# Patient Record
Sex: Male | Born: 2000 | Race: White | Hispanic: No | Marital: Single | Smoking: Current every day smoker
Health system: Southern US, Community
[De-identification: ages and names within clinical notes are randomized; demographics above are authoritative.]

## PROBLEM LIST (undated history)

## (undated) DIAGNOSIS — Z9622 Myringotomy tube(s) status: Secondary | ICD-10-CM

## (undated) DIAGNOSIS — F419 Anxiety disorder, unspecified: Secondary | ICD-10-CM

## (undated) DIAGNOSIS — H9190 Unspecified hearing loss, unspecified ear: Secondary | ICD-10-CM

## (undated) DIAGNOSIS — R569 Unspecified convulsions: Secondary | ICD-10-CM

## (undated) DIAGNOSIS — F445 Conversion disorder with seizures or convulsions: Secondary | ICD-10-CM

## (undated) DIAGNOSIS — H918X9 Other specified hearing loss, unspecified ear: Secondary | ICD-10-CM

## (undated) HISTORY — DX: Unspecified hearing loss, unspecified ear: H91.90

## (undated) HISTORY — DX: Other specified hearing loss, unspecified ear: H91.8X9

## (undated) HISTORY — DX: Anxiety disorder, unspecified: F41.9

## (undated) HISTORY — DX: Myringotomy tube(s) status: Z96.22

---
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
## 2019-05-03 DIAGNOSIS — Z711 Person with feared health complaint in whom no diagnosis is made: Secondary | ICD-10-CM | POA: Diagnosis not present

## 2019-06-15 DIAGNOSIS — K920 Hematemesis: Secondary | ICD-10-CM | POA: Diagnosis not present

## 2019-06-15 DIAGNOSIS — R634 Abnormal weight loss: Secondary | ICD-10-CM | POA: Diagnosis not present

## 2019-06-17 DIAGNOSIS — K219 Gastro-esophageal reflux disease without esophagitis: Secondary | ICD-10-CM | POA: Diagnosis not present

## 2019-06-17 DIAGNOSIS — R112 Nausea with vomiting, unspecified: Secondary | ICD-10-CM | POA: Diagnosis not present

## 2019-06-24 ENCOUNTER — Other Ambulatory Visit: Payer: Self-pay | Admitting: Gastroenterology

## 2019-06-24 DIAGNOSIS — R112 Nausea with vomiting, unspecified: Secondary | ICD-10-CM

## 2019-07-02 ENCOUNTER — Ambulatory Visit
Admission: RE | Admit: 2019-07-02 | Discharge: 2019-07-02 | Disposition: A | Discharge status: Home / Self Care | Payer: Federal, State, Local not specified - PPO | Source: Ambulatory Visit | Attending: Gastroenterology | Admitting: Gastroenterology

## 2019-07-02 ENCOUNTER — Other Ambulatory Visit: Payer: Self-pay | Admitting: Gastroenterology

## 2019-07-02 DIAGNOSIS — R112 Nausea with vomiting, unspecified: Secondary | ICD-10-CM

## 2019-07-06 ENCOUNTER — Ambulatory Visit
Admission: RE | Admit: 2019-07-06 | Discharge: 2019-07-06 | Disposition: A | Discharge status: Home / Self Care | Payer: Federal, State, Local not specified - PPO | Source: Ambulatory Visit | Attending: Gastroenterology | Admitting: Gastroenterology

## 2019-07-06 DIAGNOSIS — K219 Gastro-esophageal reflux disease without esophagitis: Secondary | ICD-10-CM | POA: Diagnosis not present

## 2019-07-06 DIAGNOSIS — R112 Nausea with vomiting, unspecified: Secondary | ICD-10-CM

## 2019-08-30 ENCOUNTER — Emergency Department (HOSPITAL_COMMUNITY): Payer: Federal, State, Local not specified - PPO

## 2019-08-30 ENCOUNTER — Encounter (HOSPITAL_COMMUNITY): Payer: Self-pay | Admitting: Emergency Medicine

## 2019-08-30 ENCOUNTER — Emergency Department (HOSPITAL_COMMUNITY)
Admission: EM | Admit: 2019-08-30 | Discharge: 2019-08-30 | Disposition: A | Discharge status: Home / Self Care | Payer: Federal, State, Local not specified - PPO | Attending: Emergency Medicine | Admitting: Emergency Medicine

## 2019-08-30 ENCOUNTER — Other Ambulatory Visit: Payer: Self-pay

## 2019-08-30 DIAGNOSIS — R110 Nausea: Secondary | ICD-10-CM | POA: Diagnosis not present

## 2019-08-30 DIAGNOSIS — R509 Fever, unspecified: Secondary | ICD-10-CM | POA: Diagnosis not present

## 2019-08-30 DIAGNOSIS — K920 Hematemesis: Secondary | ICD-10-CM | POA: Diagnosis not present

## 2019-08-30 LAB — URINALYSIS, ROUTINE W REFLEX MICROSCOPIC
Bilirubin Urine: NEGATIVE
Glucose, UA: NEGATIVE mg/dL
Hgb urine dipstick: NEGATIVE
Ketones, ur: NEGATIVE mg/dL
Leukocytes,Ua: NEGATIVE
Nitrite: NEGATIVE
Protein, ur: NEGATIVE mg/dL
Specific Gravity, Urine: 1.004 — ABNORMAL LOW (ref 1.005–1.030)
pH: 6 (ref 5.0–8.0)

## 2019-08-30 LAB — CBC
HCT: 49.3 % (ref 39.0–52.0)
Hemoglobin: 16.7 g/dL (ref 13.0–17.0)
MCH: 29.5 pg (ref 26.0–34.0)
MCHC: 33.9 g/dL (ref 30.0–36.0)
MCV: 87.1 fL (ref 80.0–100.0)
Platelets: 287 10*3/uL (ref 150–400)
RBC: 5.66 MIL/uL (ref 4.22–5.81)
RDW: 12.2 % (ref 11.5–15.5)
WBC: 4.9 10*3/uL (ref 4.0–10.5)
nRBC: 0 % (ref 0.0–0.2)

## 2019-08-30 LAB — COMPREHENSIVE METABOLIC PANEL
ALT: 19 U/L (ref 0–44)
AST: 20 U/L (ref 15–41)
Albumin: 5.2 g/dL — ABNORMAL HIGH (ref 3.5–5.0)
Alkaline Phosphatase: 80 U/L (ref 38–126)
Anion gap: 11 (ref 5–15)
BUN: 7 mg/dL (ref 6–20)
CO2: 27 mmol/L (ref 22–32)
Calcium: 10 mg/dL (ref 8.9–10.3)
Chloride: 103 mmol/L (ref 98–111)
Creatinine, Ser: 1.01 mg/dL (ref 0.61–1.24)
GFR calc Af Amer: >60 mL/min (ref >60)
GFR calc non Af Amer: >60 mL/min (ref >60)
Glucose, Bld: 105 mg/dL — ABNORMAL HIGH (ref 70–99)
Potassium: 3.5 mmol/L (ref 3.5–5.1)
Sodium: 141 mmol/L (ref 135–145)
Total Bilirubin: 1.9 mg/dL — ABNORMAL HIGH (ref 0.3–1.2)
Total Protein: 8.2 g/dL — ABNORMAL HIGH (ref 6.5–8.1)

## 2019-08-30 LAB — LIPASE, BLOOD: Lipase: 23 U/L (ref 11–51)

## 2019-08-30 MED ORDER — SODIUM CHLORIDE 0.9% FLUSH: 3.0000 mL | Freq: Once | INTRAVENOUS | Status: DC

## 2019-08-30 MED ORDER — ONDANSETRON 4 MG PO TBDP: 4.0000 mg | ORAL_TABLET | Freq: Three times a day (TID) | ORAL | 0 refills | Status: DC | PRN

## 2019-08-30 NOTE — ED Triage Notes (Signed)
Pt states he has been coughing up blood with clots also with abdominal pain x2 days. He is being sent by his Pediatrician office and is seeing [facility]. No Fevers.

## 2019-08-30 NOTE — ED Provider Notes (Signed)
[facility] Provider Note   CSN: [id number] Arrival date & time: 08/30/19  1403     History   Chief Complaint Chief Complaint  Patient presents with  . Hematemesis  . Abdominal Pain    HPI Stephen Underwood C Quincy is a 18 y.o. male presents to the ER for evaluation of blood in his vomit.  Reports bright red blood streaks in his emesis that began yesterday.  This has occurred a total of 3 times.  He vomited twice yesterday and once this morning and saw bright red streaks of blood mixed in with the emesis.  Bright red blood mixed with emesis but also when he cleared his throat after vomiting. No coffee-ground emesis.  Last time he threw up was early this morning and he has been drinking ginger ale without any vomiting since.  Admits that this happened once several months ago but he did not pay attention to it.  Today he called his primary care doctor Dr. Excell Seltzerooper who [facility].  [facility] tried [facility] but states "they are disregarding my phone calls", states that he has not been able to pay his bill so he thinks they are not going to see him in the office anymore.  Reports long history of nausea, vomiting for the last 1 year.  States he wakes up every single morning and throws up at least once.  Occasionally he will also throw up during the day.  [facility] and they have done testing and told him that he has acid reflux and "poor mobility" in his esophagus.  He is taking 40 mg of Protonix every morning on empty stomach.  This only helped for 1-1/2 weeks but then the vomiting returned.  Reports 50 pound weight loss in the last 6 months.  States sometimes just thinking about food makes him lose his appetite and he will go a whole day without eating.  States during the day he usually drinks either ginger ale or [employer].  He denies any chest pain, shortness of breath, abdominal or back pain,  lightheadedness, coffee-ground emesis, changes in his bowel movements, blood in his stool, melena.  No anticoagulants.  States he tried alcohol once but does not use this frequently.  He smoked marijuana 1 time a week ago but does not use it regularly.  He gets occasional headaches and takes ibuprofen but this is very rare.    HPI  Past Medical History:  Diagnosis Date  . History of tympanostomy tube placement 2003  . Mild to moderate hearing loss     There are no active problems to display for this patient.   History reviewed. No pertinent surgical history.      Home Medications    Prior to Admission medications   Medication Sig Start Date End Date Taking? Authorizing Provider  omeprazole (PRILOSEC) 40 MG capsule Take 40 mg by mouth daily before breakfast.  08/14/19  Yes [provider]  ondansetron (ZOFRAN ODT) 4 MG disintegrating tablet Take 1 tablet (4 mg total) by mouth every 8 (eight) hours as needed for nausea or vomiting. 08/30/19   Liberty HandyGibbons, Claudia J, PA-C    Family History Family History  Problem Relation Age of Onset  . Anemia Mother   . Depression Mother   . Heart murmur Father   . ADD / ADHD Father   . Epilepsy Father   . Peptic Ulcer Father   .  Bipolar disorder Father   . Ovarian cancer Maternal Grandmother   . Hypertension Paternal Grandfather   . Liver disease Paternal Grandfather   . Heart disease Paternal Grandfather     Social History Social History   Tobacco Use  . Smoking status: Never Smoker  . Smokeless tobacco: Never Used  Substance Use Topics  . Alcohol use: Not Currently  . Drug use: Yes    Types: Marijuana     Allergies   Patient has no known allergies.   Review of Systems Review of Systems  Gastrointestinal: Positive for nausea and vomiting.       Hematemesis   All other systems reviewed and are negative.    Physical Exam Updated Vital Signs BP (!) 132/95 (BP Location: Left Arm)   Pulse 63   Temp 98 F (36.7  C) (Oral)   Resp 11   SpO2 97%   Physical Exam Vitals signs and nursing note reviewed.  Constitutional:      Appearance: He is well-developed.     Comments: Non toxic.  HENT:     Head: Normocephalic and atraumatic.     Nose: Nose normal.  Eyes:     Conjunctiva/sclera: Conjunctivae normal.  Neck:     Musculoskeletal: Normal range of motion.     Comments: No tracheal or anterior/lateral neck tenderness, crepitus with mucous membranes.  Oropharynx and tonsils easily visualized.  Uvula is midline.  Normal oropharyngeal/throat exam. Cardiovascular:     Rate and Rhythm: Normal rate and regular rhythm.     Heart sounds: Normal heart sounds.  Pulmonary:     Effort: Pulmonary effort is normal.     Breath sounds: Normal breath sounds.  Abdominal:     General: Bowel sounds are normal.     Palpations: Abdomen is soft.     Tenderness: There is no abdominal tenderness.     Comments: No G/R/R. No suprapubic or CVA tenderness. Negative Murphy's and McBurney's  Musculoskeletal: Normal range of motion.  Skin:    General: Skin is warm and dry.     Capillary Refill: Capillary refill takes less than 2 seconds.  Neurological:     Mental Status: He is alert.  Psychiatric:        Behavior: Behavior normal.      ED Treatments / Results  Labs (all labs ordered are listed, but only abnormal results are displayed) Labs Reviewed  COMPREHENSIVE METABOLIC PANEL - Abnormal; Notable for the following components:      Result Value   Glucose, Bld 105 (*)    Total Protein 8.2 (*)    Albumin 5.2 (*)    Total Bilirubin 1.9 (*)    All other components within normal limits  URINALYSIS, ROUTINE W REFLEX MICROSCOPIC - Abnormal; Notable for the following components:   Color, Urine STRAW (*)    Specific Gravity, Urine 1.004 (*)    All other components within normal limits  LIPASE, BLOOD  CBC    EKG None  Radiology Dg Chest Portable 1 View  Result Date: 08/30/2019 CLINICAL DATA:  18 year old  male with hemoptysis. Abdominal pain 2 days. Denies fever. EXAM: PORTABLE CHEST 1 VIEW COMPARISON:  None. FINDINGS: Portable AP semi upright view at 2157 hours. Lung volumes compatible with good inspiratory effort. Normal cardiac size and mediastinal contours. Visualized tracheal air column is within normal limits. Allowing for portable technique the lungs are clear. No pneumothorax or pneumoperitoneum. Negative visible bowel gas pattern. No osseous abnormality identified. IMPRESSION: Negative portable chest. Electronically Signed  By: Odessa Fleming M.D.   On: 08/30/2019 22:15    Procedures Procedures (including critical care time)  Medications Ordered in ED Medications  sodium chloride flush (NS) 0.9 % injection 3 mL (has no administration in time range)     Initial Impression / Assessment and Plan / ED Course  I have reviewed the triage vital signs and the nursing notes.  Pertinent labs & imaging results that were available during my care of the patient were reviewed by me and considered in my medical decision making (see chart for details).  Given history, benign exam and normal ER work-up suspect Mallory-Weiss tear.  He is well-appearing.  No chest pain, shortness of breath, abdominal pain.  Hemodynamically stable.  ER work-up reviewed by me.  Hemoglobin is normal.  He is not on any blood thinners.  No known liver disease or esophageal varices.  I considered full thickness perforation of the esophagus highly unlikely.  He has no cough, chest pain or shortness of breath to suggest pulmonary/vascular pathology such as PE.  No infectious symptoms.  He reports longstanding history of nausea, vomiting for the last year but does not necessarily report any significant changes to his chronic symptoms.  Somehow patient has never been prescribed an antiemetic despite his cyclical nausea and vomiting at home daily.  He is compliant with Protonix.  No significant use of NSAIDs, alcohol use, marijuana,  anticoagulants.  Discussed with patient etiology likely a superficial esophageal tear.  Recommended soft diet, avoiding irritating foods and drinks, continuing Protonix.  Encouraged adding famotidine 10 mg twice daily and ODT Zofran to control nausea and vomiting.  Return precautions discussed.  Patient is comfortable with this.  He is having issues following up with his gastroenterology at [facility], I gave him 2 other GI offices to contact.  Final Clinical Impressions(s) / ED Diagnoses   Final diagnoses:  Hematemesis with nausea    ED Discharge Orders         Ordered    ondansetron (ZOFRAN ODT) 4 MG disintegrating tablet  Every 8 hours PRN     08/30/19 2256           Liberty Handy, PA-C 08/30/19 2354    Raeford Razor, MD 09/04/19 1104

## 2019-08-30 NOTE — Discharge Instructions (Addendum)
You were seen in the ER for bright red blood in your vomit  Labs are normal  I suspect you may have a superficial tear in your esophagus from forceful repetitive vomiting  Usually this heals with time, anti acid medicines.  It is important to avoid further vomiting.  Avoid irritating drinks and foods that will cause more damage to esophagus and stomach.  Soft gentle diet for the next 48-72 hours until symptoms improve.  Avoid spicy, greasy, acidic, hard foods. Small bites, chew well.   Avoid alcohol and marijuana, both can irritate stomach and digestion  Avoid ibuprofen, aspirin containing over the counter medicines because these can cause worsening stomach and esophagus irritation.  Take acetaminophen (tylenol) instead for pain as needed.   Take ondansetron every 8 hours under your tongue for the next 3 days to control and prevent vomiting  Stay hydrated  Call gastroenterology (see GI offices below) to make an appointment for re-evaluation   Return to ER for continued heavy bleeding in vomiting, black vomit, blood in stool, black stools, worsening abdominal pain, chest pain, shortness of breath, light headedness of passing out

## 2019-09-20 ENCOUNTER — Observation Stay (HOSPITAL_COMMUNITY): Payer: Federal, State, Local not specified - PPO

## 2019-09-20 ENCOUNTER — Other Ambulatory Visit: Payer: Self-pay

## 2019-09-20 ENCOUNTER — Observation Stay (HOSPITAL_COMMUNITY)
Admission: AD | Admit: 2019-09-20 | Discharge: 2019-09-21 | Disposition: A | Discharge status: Home / Self Care | Payer: Federal, State, Local not specified - PPO | Source: Ambulatory Visit | Attending: Pediatrics | Admitting: Pediatrics

## 2019-09-20 ENCOUNTER — Encounter (HOSPITAL_COMMUNITY): Payer: Self-pay | Admitting: Pediatrics

## 2019-09-20 DIAGNOSIS — R630 Anorexia: Secondary | ICD-10-CM

## 2019-09-20 DIAGNOSIS — Z20828 Contact with and (suspected) exposure to other viral communicable diseases: Secondary | ICD-10-CM | POA: Insufficient documentation

## 2019-09-20 DIAGNOSIS — E440 Moderate protein-calorie malnutrition: Secondary | ICD-10-CM | POA: Diagnosis present

## 2019-09-20 DIAGNOSIS — Z0000 Encounter for general adult medical examination without abnormal findings: Secondary | ICD-10-CM | POA: Diagnosis not present

## 2019-09-20 DIAGNOSIS — H538 Other visual disturbances: Secondary | ICD-10-CM | POA: Diagnosis not present

## 2019-09-20 DIAGNOSIS — R634 Abnormal weight loss: Secondary | ICD-10-CM | POA: Diagnosis not present

## 2019-09-20 DIAGNOSIS — R112 Nausea with vomiting, unspecified: Secondary | ICD-10-CM | POA: Diagnosis not present

## 2019-09-20 DIAGNOSIS — H539 Unspecified visual disturbance: Secondary | ICD-10-CM

## 2019-09-20 DIAGNOSIS — H6123 Impacted cerumen, bilateral: Secondary | ICD-10-CM | POA: Diagnosis not present

## 2019-09-20 DIAGNOSIS — Z713 Dietary counseling and surveillance: Secondary | ICD-10-CM | POA: Diagnosis not present

## 2019-09-20 DIAGNOSIS — R1110 Vomiting, unspecified: Secondary | ICD-10-CM | POA: Diagnosis present

## 2019-09-20 DIAGNOSIS — Z7182 Exercise counseling: Secondary | ICD-10-CM | POA: Diagnosis not present

## 2019-09-20 DIAGNOSIS — Z23 Encounter for immunization: Secondary | ICD-10-CM | POA: Diagnosis not present

## 2019-09-20 DIAGNOSIS — R1115 Cyclical vomiting syndrome unrelated to migraine: Secondary | ICD-10-CM

## 2019-09-20 DIAGNOSIS — Z68.41 Body mass index (BMI) pediatric, 5th percentile to less than 85th percentile for age: Secondary | ICD-10-CM | POA: Diagnosis not present

## 2019-09-20 DIAGNOSIS — R519 Headache, unspecified: Secondary | ICD-10-CM | POA: Diagnosis not present

## 2019-09-20 DIAGNOSIS — H5713 Ocular pain, bilateral: Secondary | ICD-10-CM | POA: Diagnosis not present

## 2019-09-20 LAB — CBC WITH DIFFERENTIAL/PLATELET
Abs Immature Granulocytes: 0.01 K/uL (ref 0.00–0.07)
Basophils Absolute: 0 K/uL (ref 0.0–0.1)
Basophils Relative: 1 %
Eosinophils Absolute: 0.1 K/uL (ref 0.0–0.5)
Eosinophils Relative: 1 %
HCT: 46 % (ref 39.0–52.0)
Hemoglobin: 15.6 g/dL (ref 13.0–17.0)
Immature Granulocytes: 0 %
Lymphocytes Relative: 33 %
Lymphs Abs: 2.7 K/uL (ref 0.7–4.0)
MCH: 29.4 pg (ref 26.0–34.0)
MCHC: 33.9 g/dL (ref 30.0–36.0)
MCV: 86.8 fL (ref 80.0–100.0)
Monocytes Absolute: 0.4 K/uL (ref 0.1–1.0)
Monocytes Relative: 5 %
Neutro Abs: 4.9 K/uL (ref 1.7–7.7)
Neutrophils Relative %: 60 %
Platelets: 290 K/uL (ref 150–400)
RBC: 5.3 MIL/uL (ref 4.22–5.81)
RDW: 12.6 % (ref 11.5–15.5)
WBC: 8.1 K/uL (ref 4.0–10.5)
nRBC: 0 % (ref 0.0–0.2)

## 2019-09-20 LAB — BASIC METABOLIC PANEL WITH GFR
Anion gap: 15 (ref 5–15)
BUN: 8 mg/dL (ref 6–20)
CO2: 24 mmol/L (ref 22–32)
Calcium: 9.7 mg/dL (ref 8.9–10.3)
Chloride: 101 mmol/L (ref 98–111)
Creatinine, Ser: 0.89 mg/dL (ref 0.61–1.24)
GFR calc Af Amer: >60 mL/min
GFR calc non Af Amer: >60 mL/min
Glucose, Bld: 117 mg/dL — ABNORMAL HIGH (ref 70–99)
Potassium: 4 mmol/L (ref 3.5–5.1)
Sodium: 140 mmol/L (ref 135–145)

## 2019-09-20 LAB — HEPATIC FUNCTION PANEL
ALT: 14 U/L (ref 0–44)
AST: 25 U/L (ref 15–41)
Albumin: 4.6 g/dL (ref 3.5–5.0)
Alkaline Phosphatase: 65 U/L (ref 38–126)
Bilirubin, Direct: 0.4 mg/dL — ABNORMAL HIGH (ref 0.0–0.2)
Indirect Bilirubin: 1 mg/dL — ABNORMAL HIGH (ref 0.3–0.9)
Total Bilirubin: 1.4 mg/dL — ABNORMAL HIGH (ref 0.3–1.2)
Total Protein: 6.9 g/dL (ref 6.5–8.1)

## 2019-09-20 LAB — URIC ACID: Uric Acid, Serum: 5.5 mg/dL (ref 3.7–8.6)

## 2019-09-20 LAB — TSH: TSH: 2.33 u[IU]/mL (ref 0.350–4.500)

## 2019-09-20 LAB — SARS CORONAVIRUS 2 BY RT PCR (HOSPITAL ORDER, PERFORMED IN ~~LOC~~ HOSPITAL LAB): SARS Coronavirus 2: NEGATIVE

## 2019-09-20 LAB — AMYLASE: Amylase: 76 U/L (ref 28–100)

## 2019-09-20 LAB — MAGNESIUM: Magnesium: 2.2 mg/dL (ref 1.7–2.4)

## 2019-09-20 LAB — HIV ANTIBODY (ROUTINE TESTING W REFLEX): HIV Screen 4th Generation wRfx: NONREACTIVE

## 2019-09-20 LAB — C-REACTIVE PROTEIN: CRP: <0.8 mg/dL

## 2019-09-20 LAB — LACTATE DEHYDROGENASE: LDH: 275 U/L — ABNORMAL HIGH (ref 98–192)

## 2019-09-20 LAB — T4, FREE: Free T4: 1.25 ng/dL — ABNORMAL HIGH (ref 0.61–1.12)

## 2019-09-20 LAB — SEDIMENTATION RATE: Sed Rate: 1 mm/hr (ref 0–16)

## 2019-09-20 LAB — PHOSPHORUS: Phosphorus: 3.4 mg/dL (ref 2.5–4.6)

## 2019-09-20 LAB — LIPASE, BLOOD: Lipase: 30 U/L (ref 11–51)

## 2019-09-20 MED ORDER — ONDANSETRON HCL 4 MG/2ML IJ SOLN: 4.0000 mg | Freq: Three times a day (TID) | INTRAMUSCULAR | Status: DC | PRN

## 2019-09-20 MED ORDER — ACETAMINOPHEN 325 MG PO TABS: 650.0000 mg | ORAL_TABLET | Freq: Four times a day (QID) | ORAL | Status: DC | PRN

## 2019-09-20 MED ORDER — POLYETHYLENE GLYCOL 3350 17 G PO PACK
17.0000 g | PACK | Freq: Every day | ORAL | Status: DC
  Administered 2019-09-21: 17 g via ORAL
  Filled 2019-09-20: qty 1

## 2019-09-20 MED ORDER — SODIUM CHLORIDE 0.9 % IV SOLN
INTRAVENOUS | Status: DC
  Administered 2019-09-20: 500 mL via INTRAVENOUS

## 2019-09-20 MED ORDER — PANTOPRAZOLE SODIUM 20 MG PO TBEC
80.0000 mg | DELAYED_RELEASE_TABLET | Freq: Every day | ORAL | Status: DC
  Administered 2019-09-21: 80 mg via ORAL
  Filled 2019-09-20: qty 4

## 2019-09-20 MED ORDER — PANTOPRAZOLE SODIUM 20 MG PO TBEC: 80.0000 mg | DELAYED_RELEASE_TABLET | Freq: Every day | ORAL | Status: DC

## 2019-09-20 NOTE — H&P (Addendum)
Pediatric Teaching Program H&P [address]  [place], [place] [address] Phone: [phone] Fax: [phone]   Patient Details  Name: Stephen Underwood MRN: [id number] DOB: [birth year] Age: 18 y.o.          Gender: male  Chief Complaint  Ongoing vomiting with weight loss  History of the Present Illness  Stephen Underwood is a 18 y.o. male who presents with approximately 1 year of vomiting with an increase in frequency over the past few months.  Over the last 3 months the patient has had an increase in vomiting primarily in the early morning.  He states that nausea just prior to the vomiting wakes him up sometime between the hours of 3:30 in the morning and 8 AM.    Patient states for the past 3 months he has been throwing up nearly every single day. He states that when he eats food later in the day he typically does not throw up, but that he throws up all the food that he eats at night almost every morning.   During this time he is also noted an increase in headaches with pressure behind his eyes that several times a week feels like "his eyes are coming out of his head".  Patient has recently complained of black spots that show up in his visual field that come and go.  Patient has also noticed a significant weight loss over the last year.    Patient has been worked up with GI who performed an upper GI on 07/06/19 which showed showing esophageal dysmotility.  Patient was started on omeprazole with Zofran for nausea and reflux, neither of which improved his symptoms.  Patient reports having had diarrhea this morning, but usually he has stools that look like "rabbit poop."  He has a history of constipation and took laxatives as a teen when he was in [employer] school and taking the laxatives made him stool himself.  Patient was seen by his PCP Dr. Burt Knack today, on his evaluation he noted new vision and hearing deficits.  Vision was previously 20/20 out of both eyes, now  20/40 and 20/50.  The patient also had a previously normal hearing screen approximately 1 year ago, however he failed this today.  PCP also noticed a 13 pound weight loss since the summer and a 30 pound weight loss since last year.  PCP's physical exam today was otherwise reassuring with PERRLA, EOMI, cranial nerves II through XII intact, strength, coordination intact. Funduscopic exam was attempted but was limited.  Patient states he only drinks alcohol on occasion, he does admit to a history of marijuana use.  Review of Systems  General: Positive for headaches, Neuro: No lightheadedness, no dizziness, HEENT: Decreased visual acuity, Respiratory: No recent cough, MSK: No weakness in limbs.  Past Birth, Medical & Surgical History  ADD as a child-since resolved-no treatment Recently diagnosed with reflux from GI  Developmental History  Mom believes he had all his developmental milestones  Diet History  Patient eats normal diet  Family History  Maternal family history of thyroid issues, hypertension, adult onset diabetes, colon cancer, and reflux.  No family history of autoimmune conditions  Social History  Dad not in the picture.  Lives with a friend.  Mom is in the picture.  Primary Care Provider  Dr. Burt Knack  Home Medications  Medication     Dose Omeprazole  84m po Qdaily         Allergies  No Known Allergies  Immunizations  Up-to-date on immunizations except for flu shot.  Exam  BP 115/78 (BP Location: Left Arm)   Pulse 83   Temp 98.1 F (36.7 C) (Oral)   Resp 14   Ht 5' 8"  (1.727 m)   Wt 63 kg   SpO2 98%   BMI 21.12 kg/m   Weight:     28 %ile (Z= -0.59) based on CDC (Boys, 2-20 Years) weight-for-age data using vitals from 09/20/2019.  General: Well appearing, well developed HEENT: Normocephalic, Atraumatic, PERRL, EOMI, nares clear, oropharynx normal in appearance, MMM **fundoscopic exam later performed by night team and showed clear discs, no evidence of  obvious papilledema Respiratory: Normal work of breathing. Clear to ascultation. No wheezing, rhonchi, or crackles Cardiovascular: RRR, no murmurs Abdominal: Normoactive bowel sounds, soft, nondistended, no hepatosplenomegaly, tenderness to palpation on the right mid abdomen just to the right of the umbilicus and also on the left mid abdomen just to the left of the umbilicus, palpable stool on the left Extremities: Moves all extremities equally Musculoskeletal: Normal tone and bulk, upper extremity muscles feel stringy/gravelly Neuro: No focal deficits, CN II through XII intact, finger-to-nose testing intact, strength 5/5 bilaterally in upper and lower extremities, fine touch sensation intact bilaterally. Skin: No rashes, lesions or bruising Lymph: 2 0.5cm inguinal lymph nodes bilaterally   Selected Labs & Studies  -BMP 10/12-significant for albumin elevated 5.2, total protein elevated 8.2, total bilirubin 1.9. -CBC 10/12-within normal limits -UA 10/12-within normal limits -Chest x-ray 10/12-no acute findings -Upper GI series 8/18 - nonspecific esophageal motility disorder with disruption of the primary peristaltic stripping wave at the level of mid esophagus on 3 out of 4 swallows.  Gastroesophageal reflux.  Assessment  Principal Problem:   Weight loss Active Problems:   Vomiting   Vision changes  Stephen Underwood is a 18 y.o. male admitted for 1 year of chronic vomiting, weight loss, with recent history of headaches and change in hearing in vision. The history of ongoing vomiting with weight loss in a pediatric patient who is also noted to have headaches as well as a decrease in visual and auditory acuity is certainly concerning for intracranial process such as brain tumor or increased intracranial pressure.  Other differentials for his ongoing vomiting and weight loss can include endocrine abnormality such as hyperthyroidism, GI dysfunction such as severe GERD, SMA syndrome, food  allergies, celiac's disease, however these would not explain his recent hearing and vision changes.  Chronic weight loss with vomiting and change in vision could also represent autoinflammatory/autoimmune disorder.  Patient with a history of marijuana use and chronic vomiting would also fit the clinical picture for cannabinoid hyperemesis syndrome.  Patient is admitted for extensive diagnostic work-up.  Plan   Chronic vomiting with weight loss: -Brain MRI to evaluate for increased intracranial pressure/rule out malignancy -KUB -CBC, BMP, mag, Phos -Hepatic panel -Amylase, lipase -CRP, ESR, LDH -TSH -UDS -HIV -fecal calprotectin -ANA -celiac labs -Fecal occult blood test -If brain MRI negative, can consult adult GI (prev seen by [facility]) -Continue PPI, will also add daily Miralax given history of constipation  Vision changes: -Ophthalmology consult  FENGI: Normal pediatric diet  Interpreter present: no  Lurline Del, DO 09/20/2019, 7:08 PM   I personally saw and evaluated the patient, and participated in the management and treatment plan as documented in the resident's note with changes and additions made above.  Jeanella Flattery, MD 09/20/2019 10:01 PM

## 2019-09-21 ENCOUNTER — Encounter (HOSPITAL_COMMUNITY): Payer: Self-pay

## 2019-09-21 ENCOUNTER — Observation Stay (HOSPITAL_COMMUNITY): Payer: Federal, State, Local not specified - PPO

## 2019-09-21 DIAGNOSIS — Z68.41 Body mass index (BMI) pediatric, 5th percentile to less than 85th percentile for age: Secondary | ICD-10-CM | POA: Diagnosis not present

## 2019-09-21 DIAGNOSIS — Z20828 Contact with and (suspected) exposure to other viral communicable diseases: Secondary | ICD-10-CM | POA: Diagnosis not present

## 2019-09-21 DIAGNOSIS — H538 Other visual disturbances: Secondary | ICD-10-CM | POA: Diagnosis not present

## 2019-09-21 DIAGNOSIS — R1110 Vomiting, unspecified: Secondary | ICD-10-CM | POA: Diagnosis not present

## 2019-09-21 DIAGNOSIS — E440 Moderate protein-calorie malnutrition: Secondary | ICD-10-CM

## 2019-09-21 DIAGNOSIS — R634 Abnormal weight loss: Secondary | ICD-10-CM | POA: Diagnosis not present

## 2019-09-21 DIAGNOSIS — R519 Headache, unspecified: Secondary | ICD-10-CM | POA: Diagnosis not present

## 2019-09-21 DIAGNOSIS — R630 Anorexia: Secondary | ICD-10-CM

## 2019-09-21 LAB — URINALYSIS, ROUTINE W REFLEX MICROSCOPIC
Bilirubin Urine: NEGATIVE
Glucose, UA: NEGATIVE mg/dL
Hgb urine dipstick: NEGATIVE
Ketones, ur: NEGATIVE mg/dL
Leukocytes,Ua: NEGATIVE
Nitrite: NEGATIVE
Protein, ur: NEGATIVE mg/dL
Specific Gravity, Urine: 1.033 — ABNORMAL HIGH (ref 1.005–1.030)
pH: 5 (ref 5.0–8.0)

## 2019-09-21 LAB — RAPID URINE DRUG SCREEN, HOSP PERFORMED
Amphetamines: NOT DETECTED
Barbiturates: NOT DETECTED
Benzodiazepines: NOT DETECTED
Cocaine: NOT DETECTED
Opiates: NOT DETECTED
Tetrahydrocannabinol: POSITIVE — AB

## 2019-09-21 LAB — OCCULT BLOOD X 1 CARD TO LAB, STOOL: Fecal Occult Bld: NEGATIVE

## 2019-09-21 LAB — GAMMA GT: GGT: <5 U/L — ABNORMAL LOW (ref 7–50)

## 2019-09-21 LAB — VITAMIN B12: Vitamin B-12: 432 pg/mL (ref 180–914)

## 2019-09-21 MED ORDER — POLYETHYLENE GLYCOL 3350 17 G PO PACK: 17.0000 g | PACK | Freq: Once | ORAL | Status: DC

## 2019-09-21 MED ORDER — ADULT MULTIVITAMIN W/MINERALS CH
1.0000 | ORAL_TABLET | Freq: Every day | ORAL | Status: DC
  Administered 2019-09-21: 1 via ORAL
  Filled 2019-09-21: qty 1

## 2019-09-21 MED ORDER — GADOBUTROL 1 MMOL/ML IV SOLN
6.3000 mL | Freq: Once | INTRAVENOUS | Status: AC | PRN
  Administered 2019-09-21: 6.3 mL via INTRAVENOUS

## 2019-09-21 MED ORDER — INFLUENZA VAC SPLIT QUAD 0.5 ML IM SUSY
0.5000 mL | PREFILLED_SYRINGE | INTRAMUSCULAR | Status: DC
  Filled 2019-09-21: qty 0.5

## 2019-09-21 MED ORDER — ENSURE ENLIVE PO LIQD
237.0000 mL | Freq: Three times a day (TID) | ORAL | Status: DC
  Administered 2019-09-21: 237 mL via ORAL
  Filled 2019-09-21 (×4): qty 237

## 2019-09-21 NOTE — Progress Notes (Signed)
Pediatric Teaching Program  Progress Note   Subjective  Patient states that he has been smoking marijuana occasionally since high school.  He states a 3 g bag lasted approximately a month and that he smokes typically 2-3 times a week, sometimes as many as 4, sometimes as few as 1.   He states that this morning he does have some nausea but that his abdominal pain from yesterday has mostly resolved.  He states he was able to eat something food for dinner last night and did not have nausea or vomiting after this. Patient's uncle spoke to me alone in the hallway and stated he felt his anxiety/depression might be a big component of the patient's vomiting.  He states the patient over the past 3 months had a "rough patch" where he was hired at a stressful job, subsequently fired approximately 3 weeks later, and then lost his Therapist, nutritional.  He states that the patient's family typically does not address depression or anxiety and that because of this the patient does not really talk about how he feels, and so the uncle wanted [place] to be aware.  Objective  Temp:  [97.9 F (36.6 C)-98.1 F (36.7 C)] 97.9 F (36.6 C) (11/03 0400) Pulse Rate:  [56-112] 56 (11/03 0500) Resp:  [12-23] 15 (11/03 0500) BP: (113-115)/(78-79) 113/79 (11/03 0254) SpO2:  [95 %-99 %] 98 % (11/03 0254) Weight:  [45 kg] 63 kg (11/02 1847)  General: Well appearing, resting comfortably in bed HEENT: Normocephalic, Atraumatic, unable to visualize tympanic membranes due to earwax. Respiratory: Normal work of breathing. Clear to ascultation. No wheezing, rhonchi, or crackles Cardiovascular: RRR, no murmurs Abdominal:Normoactive bowel sounds, soft, nondistended, no tenderness to palpation Musculoskeletal: Normal tone and bulk Neuro: No focal deficits  Labs and studies were reviewed and were significant for: MRI-normal Abdominal GI series-normal UDS-positive for THC TSH-within normal limits, T4-slightly elevated 1.25 Sed  rate-1 LDH-elevated at 275 Indirect bilirubin slightly elevated at 1.0 CRP- <0.8  Assessment  Stephen Underwood is a 18 y.o. male admitted for diagnostic work-up after having 1 year of nausea, vomiting, unintentional weight loss.  Patient has also endorsed headaches and change in hearing and vision over this time period.  MRI negative for acute intracranial processes.  Patient endorses history of marijuana use regularly.  This makes cannabinoid hyperemesis syndrome a potential etiology for his ongoing vomiting and weight loss, however most of his vomiting is early morning and does not occur after meals later in the day.  Also of note the patient's uncle endorsed that he felt the patient's anxiety/depression might be a bigger component of his vomiting and weight loss.  Plan   Chronic vomiting with weight loss: -Spoke to GI, they have scheduled a 1:15 PM outpatient appointment for him tomorrow -Recommend marijuana cessation -Continue PPI, add MiraLAX daily for history of constipation -Follow-up on celiac labs, ANA, fecal occult/fecal calprotectin  Vision changes: -Ophthalmology consult-plan to see around 6 or 7 PM tonight. -We will likely discharge patient after this   FEN GI: Normal pediatric diet  Interpreter present: no   LOS: 0 days   Lurline Del, DO 09/21/2019, 6:50 AM

## 2019-09-21 NOTE — Progress Notes (Signed)
Pt has had a good day, VSS and afebrile. Pt alert and oriented, cooperative. Lung sounds clear, RR 16-18, O2 sats 98-100% on RA, no WOB. HR 60's-70's, pulses +3 in all extremities, cap refill less than 3 seconds. Pt has been eating well, good UOP. No emesis noted. No pain noted. BM x1. PIV removed at 1830 with discharge order, just waiting for opthamology to come by and after pt to be discharged. Mother at bedside, attentive to needs.

## 2019-09-21 NOTE — Progress Notes (Signed)
INITIAL PEDIATRIC/NEONATAL NUTRITION ASSESSMENT Date: 09/21/2019   Time: 2:14 PM  Reason for Assessment: Consult for calorie count  ASSESSMENT: Male 18 y.o.   Admission Dx/Hx: Weight loss  18 y.o. male admitted for diagnostic work-up after having 1 year of nausea, vomiting, unintentional weight loss.  Patient has also endorsed headaches and change in hearing and vision over this time period.  MRI negative for acute intracranial processes. Patient endorses history of marijuana use regularly. Cannabinoid hyperemesis syndrome and/or anxiety depression as potential etiologies for his ongoing vomiting and weight loss.  Weight: 63 kg(28%) Length/Ht: 5' 8"  (172.7 cm) (30%) Body mass index is 21.12 kg/m. Plotted on CDC growth chart  Assessment of Growth: Pt meets criteria for MODERATE MALNUTRITION as evidenced by a 19% weight loss from usual body weight and estimated inadequate nutrient intake of 25-50% of energy/protein needs.   Pt with a 19% weight loss within 1 year, which is significant for time frame.   Diet/Nutrition Support: Regular diet with thin liquids.   Usual daily intake: Breakfast: Carnation instant breakfast made with whole milk Snack: peanut butter crackers Dinner at ~6pm: Steak and cheese sub  Pt reports he is unable to eat full meals prior to dinner time due to nausea/vomiting. Pt reports binging on food during dinner time from 6-9 pm to consume as much calories as he can to make up for his nutrition from earlier in the day. Pt expresses unintentional vomiting every morning and feels he is vomiting all his food from the night prior. Pt avoids spicy foods as it exacerbates his abdominal discomfort and n/v.   Estimated Needs:  37-42 ml/kg 42 Kcal/kg 2-3 g Protein/kg   Meal completion has been 75%. Pt able to tolerate dinner last night and breakfast this AM without emesis. Pt reports no appetite during time of visit, however encouraged to eat at meals to aid in caloric and  protein needs. Pt encouraged and educated to continue protein shake supplementation post discharge at least 2-3 times daily along with consistent meals throughout the day. Additionally recommend a MVI daily to ensure adequate vitamins and minerals are met. Pt expressed understanding of information discussed. Possible plans to discharge home this evening with GI follow up tomorrow. RD to order nutritional supplementation and MVI while pt remains inpatient to aid in nutrition.   Urine Output: 500 ml  Related Meds: Zofran, protonix  Labs reviewed.   IVF:    NUTRITION DIAGNOSIS: -Malnutrition (NI-5.2) related inadequate oral intake, n/v as evidenced by a 19% weight loss from usual body weight and estimated inadequate nutrient intake of 25-50% of energy/protein needs.  Status: Ongoing  MONITORING/EVALUATION(Goals): PO intake Weight trends Labs I/O's  INTERVENTION:   Provide Ensure Enlive po TID, each supplement provides 350 kcal and 20 grams of protein   Provide multivitamin once daily.   Corrin Parker, MS, RD, LDN Pager # [phone] After hours/ weekend pager # [phone]
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
## 2019-09-21 NOTE — Progress Notes (Signed)
Patient declines flu vaccine.  Mother states they will follow up with his pediatrician. Flu vaccine not given.

## 2019-09-21 NOTE — Plan of Care (Signed)
Reviewed cone general education and information regarding inpatient and unit with mom, uncle, and patient.  No concerns expressed.  Verbalizes understanding and agreeement.

## 2019-09-21 NOTE — Discharge Summary (Addendum)
Pediatric Teaching Program Discharge Summary [address]  [place], [place] [address] Phone: [phone] Fax: [phone]   Patient Details  Name: Stephen Underwood MRN: [id number] DOB: 2000-12-13 Age: 18 y.o.          Gender: male  Admission/Discharge Information   Admit Date:  09/20/2019  Discharge Date:  09/21/2019  Length of Stay: 1   Reason(s) for Hospitalization  Chronic emesis with unintentional weight loss  Problem List   Principal Problem:   Weight loss Active Problems:   Vomiting   Vision changes   Moderate malnutrition (HCC)   Poor appetite   Final Diagnoses  Unintentional weight loss, concern for anxiety  Brief Hospital Course (including significant findings and pertinent lab/radiology studies)  Stephen Underwood is a 18 y.o. male admitted for evaluation from his PCPs office due to 1 year of ongoing emesis that has acutely worsened over the past 3 months with daily episodes of vomiting in the morning, and ~30 pounds of unintentional weight loss.  The patient had also endorsed recent changes in his vision and hearing and pressure behind his eyes.    MRI brain to evaluate for intracranial mass was normal. CBC and BMP were essentially normal. CRP and ESR were both normal.  Hepatic panel was significant for slightly elevated bilirubin of 1.4, with an indirect of 1.0, and GGT <5.  Lipase and Amylase wnl. LDH slightly elevated at 275, uric acid 5.5.  Vit B-12 wnl.  TSH 2.3, FT4 1.25. Nonreactive HIV screen. Fecal occult blood negative. ANA, Celiac panel and fecal calprotectin are pending. Urinalysis was unremarkable. COVID negative. Patient did have a urine drug screen positive for THC and endorsed that he regularly smokes marijuana approximately 3 times per week.  [facility], who had seen him for a televisit previously, was consulted and recommended the patient follow-up during an outpatient appointment scheduled for 11/4.  Patient's uncle was  also with him during his hospitalization and stated in private that he felt the patient's anxiety and depression, as well as his inability to "properly cope with anxiety and depression" might be contributing to his nausea, vomiting, and weight loss. Pt reportedly has had significant stressors over the past few weeks including a new job and then subsequent loss of job due to missed days from sickness, recent death of his grandfather, and worries around eating/vomiting. These concerns were relayed to Pt's PCP Dr. Burt Knack via telephone.   Due to recent change in vision ophthalmology was consulted. Dr. Posey Pronto found no evidence of uveitis or acute angle closure glaucoma. She was concerned about a small possibility of acute on chronic acute angle glaucoma, so if he has persistent eye pain, Stephen Underwood should follow up by calling Dr. Serita Grit office.   Overall, Pt's constellation of symptoms including significant weight loss, vomiting, poor appetite, is unclear at this time, but suspect there is a component of undiagnosed anxiety +/- regular marijuana use exacerbating his symptoms. Also initial concern for possible autoimmune process including IBD vs. IBS picture (given report of episodes of constipation/diarrhea), however, given normal ESR/CRP an inflammatory process is less likely. Reassured against an intracranial process (ie. Brain tumor) contributing to his early morning vomiting given normal MRI, and overall reassured that Pt's symptoms were reportedly getting better over the past week with eating carnation instant breakfasts. Pt will be discharged to home for close PCP follow-up for evaluation of anxiety, and GI follow-up for ongoing outpatient evaluation of vomiting/weight loss (possible H pylori work up).  Procedures/Operations  Brain MRI  Consultants  Ophthalmology Gastroenterology  Focused Discharge Exam  Temp:  [97.7 F (36.5 C)-98.2 F (36.8 C)] 97.7 F (36.5 C) (11/03 1928) Pulse Rate:  [56-112] 75  (11/03 1928) Resp:  [12-23] 20 (11/03 1928) BP: (112-118)/(59-79) 116/59 (11/03 1928) SpO2:  [95 %-100 %] 100 % (11/03 1928) General: thin and pale 18 y/o male lying in bed in NAD. Scratch marks on neck. CV: RRR, normal S1 and S2, no murmurs. Radial pulses palpable. Pulm: lungs CTAB, no wheezes or crackles, normal WOB. Abd: soft, mild TTP in epigastric area, no guarding or rebound. No organemgaly. Ext: thin, but no obvious deformity Neuro: no focal deficits, alert and oriented, answers questions appropriately, speech normal  Interpreter present: no  Discharge Instructions   Discharge Weight: 63 kg   Discharge Condition: Improved  Discharge Diet: Resume diet  Discharge Activity: Ad lib   Discharge Medication List   Allergies as of 09/21/2019   No Known Allergies      Medication List     TAKE these medications    omeprazole 40 MG capsule Commonly known as: PRILOSEC Take 40 mg by mouth daily before breakfast.        Immunizations Given (date): none  Follow-up Issues and Recommendations  1.  Recommend PCP follow-up to discuss depression/anxiety - would benefit from GAD-7 and PHQ-9. 2.  Patient will follow up with [facility] on 11/4 at 1:15pm for further work-up, GI team to follow up on labs listed below 3.  Ophthalmology recommended Stephen Underwood to call their office if continued eye pain 4.  Patient with earwax buildup, have recommended patient use Debrox eardrops to help clean this out-recommend PCP repeat auditory test after earwax has been removed. 5. Discontinue use of marijuana - PCP to re-evaluate and plug in with community resources as needed  Pending Results   Unresulted Labs (From admission, onward)     Start     Ordered   09/20/19 1921  Gliadin antibodies, serum  (Celiac Panel (PNL))  Once,   R     09/20/19 1921   09/20/19 1921  Tissue transglutaminase, IgA  (Celiac Panel (PNL))  Once,   R     09/20/19 1921   09/20/19 1921  Reticulin Antibody, IgA w reflex titer   (Celiac Panel (PNL))  Once,   R     09/20/19 1921   09/20/19 1846  Calprotectin, Fecal  Once,   R     09/20/19 1845   09/20/19 1845  ANA  Once,   R     09/20/19 1844            Future Appointments   Follow-up Information     Gastroenterology, [facility]. Go on 09/22/2019.   Why: Appointment at 1:15 PM Contact information: [place] [address] [phone]             Bayard Males, MD 09/21/2019, 8:32 PM

## 2019-09-21 NOTE — Discharge Instructions (Addendum)
It was a pleasure taking care of Stephen Underwood during his hospitalization.  He was hospitalized for his ongoing vomiting and weight loss.  He received a brain MRI to look for any brain or intracranial abnormalities.  His MRI was negative and his overall labs have been mostly within normal limits.  He is plan to follow-up with [facility] on 11/4 at 1:15 PM.  Ophthalmology was also consulted and saw Stephen Underwood during the evening of 11/3.     - We recommend using Debrox eardrops to help clear earwax, then having a repeat hearing test at your primary care doctor's office.   Cyclic Vomiting Syndrome, Adult Cyclic vomiting syndrome (CVS) is a condition that causes episodes of severe nausea and vomiting. It can last for hours or even days. Attacks may occur several times a month or several times a year. Between episodes of CVS, you may be otherwise healthy. CVS is also called abdominal migraine. What are the causes? The cause of this condition is not known. Although many of the episodes can happen for no obvious reason, you may have specific CVS triggers. Episodes may be triggered by:  An infection, especially colds and the flu.  Emotional stress, including excitement or anxiety about upcoming events, such as school, parties, or travel.  Certain foods or beverages, such as chocolate, cheese, alcohol, and food additives.  Motion sickness.  Eating a large meal before bed.  Being very tired.  Being too hot. What increases the risk? You are more likely to develop this condition if:  You get migraine headaches.  You have a family history of CVS or migraine headaches. What are the signs or symptoms? Symptoms tend to happen at the same time of day, and each episode tends to last about the same amount of time. Symptoms commonly start at night or when you wake up. Many people have warning signs (prodrome) before an episode, which may include slight nausea, sweating, and pale skin (pallor). The most common  symptoms of a CVS attack include:  Severe vomiting. Vomiting may happen every 5-15 minutes.  Severe nausea.  Gagging (retching). Other symptoms may include:  Headache.  Dizziness.  Sensitivity to light.  Extreme thirst.  Abdominal pain. This can be severe.  Loose stools or diarrhea.  Fever.  Pale skin (pallor), especially on the face.  Weakness.  Exhaustion.  Sleepiness after a CVS episode.  Dehydration. This can cause: ? Thirst. ? Dry mouth. ? Decreased urination. ? Fatigue. How is this diagnosed? This condition may be diagnosed based on your symptoms, medical history, and family history of CVS or migraine. Your health care provider will ask whether you have had:  Episodes of severe nausea and vomiting that have happened a total of 5 or more times, or 3 or more times in the past 6 months.  Episodes that last for 1 hour or more, and occur 1 week apart or farther apart.  Episodes that are similar each time.  Normal health between episodes. Your health care provider will also do a physical exam. To rule out other conditions, you may have tests, such as:  Blood tests.  Urine tests.  Imaging tests. How is this treated? There is no cure for this condition, but treatment can help manage or prevent CVS episodes. Work with your health care provider to find the best treatment for you. Treatment may include:  Avoiding stress and CVS triggers.  Eating smaller, more frequent meals.  Taking medicines, such as: ? Over-the-counter pain medicine. ? Anti-nausea medicines. ?  Antacids. ? Antihistamines. ? Medicines for migraines. ? Antidepressants. ? Antibiotics. Severe nausea and vomiting may require you to stay at the hospital. You may need IV fluids to prevent or treat dehydration. Follow these instructions at home: During an episode  Take over-the-counter and prescription medicines only as told by your health care provider.  Stay in bed and rest in a dark,  quiet room. After an episode   Drink an oral rehydration solution (ORS), if directed by your health care provider. This is a drink that helps you replace fluids and the salts and minerals in your blood (electrolytes). It can be found at pharmacies and retail stores.  Drink small amounts of clear fluids slowly and gradually add more. ? Drink clear fluids such as water or fruit juice that has water added (is diluted). You may also eat low-calorie popsicles. ? Avoid drinking fluids that contain a lot of sugar or caffeine, such as sports drinks and soda.  Eat soft foods in small amounts every 3-4 hours. Eat your regular diet, but avoid spicy or fatty foods, such as french fries and pizza. General instructions  Monitor your condition for any changes.  If you were prescribed an antibiotic medicine, take it as told by your health care provider. Do not stop taking the antibiotic even if you start to feel better.  Keep track of your attacks and symptoms, and pay attention to any triggers. Avoid those triggers when you can.  Keep all follow-up visits as told by your health care provider. This is important. Contact a health care provider if:  Your condition gets worse.  You cannot drink fluids without vomiting.  You have pain and trouble swallowing after an episode. Get help right away if:  You have blood in your vomit.  Your vomit looks like coffee grounds.  You have stools that are bloody or black, or stools that look like tar.  You have signs of dehydration, such as: ? Sunken eyes. ? Not making tears while crying. ? Very dry mouth. ? Cracked lips. ? Decreased urine production. ? Dark urine. Urine may be the color of tea. ? Weakness. ? Sleepiness. Summary  Cyclic vomiting syndrome (CVS) causes episodes of severe nausea and vomiting that can last for hours or even days.  Vomiting and diarrhea can make you feel weak and can lead to dehydration. If you notice signs of dehydration,  call your health care provider right away.  Treatment can help you manage or prevent CVS episodes. Work with your health care provider to find the best treatment for you.  Keep all follow-up visits as told by your health care provider. This is important. This information is not intended to replace advice given to you by your health care provider. Make sure you discuss any questions you have with your health care provider. Document Released: 12/20/2016 Document Revised: 02/24/2019 Document Reviewed: 12/20/2016 Elsevier Patient Education  2020 [employer].

## 2019-09-21 NOTE — Consult Note (Signed)
Stephen Underwood                                                                               09/21/2019                                               Pediatric Ophthalmology Consultation                                         Consult requested by: Dr. Ronalee Red  Reason for consultation:  Cyclic vomiting  HPI: 18yo male with cyclic and recurrent vomiting, 30+ unintentional weight loss over the last few months. Patient typically starts vomiting at 3am and will continue until 7am. States has not vomited in about eight days; irregular association with boring eye pain that progresses to headache that results in vomiting. Mother with history of migraines. Has recently had blood in vomitus. Smokes weed frequently, last joint was on Saturday.   Was admitted for workup and MRI, fortunately MRI without mass or other noted abnormality. Adult GI to workup as an outpatient.  Pertinent Medical History:   Active Ambulatory Problems    Diagnosis Date Noted  . No Active Ambulatory Problems   Resolved Ambulatory Problems    Diagnosis Date Noted  . No Resolved Ambulatory Problems   Past Medical History:  Diagnosis Date  . History of tympanostomy tube placement 2003  . Mild to moderate hearing loss      Pertinent Ophthalmic History: None     Current Eye Medications: none  Systemic medications on admission:   No medications prior to admission.       ROS: Currently without complaint; as in HPI  Visual Fields: FTC OU      Pupils:  Equal, brisk, no APD  Near acuity:      20/20    OD      20/20    OS  TA:       OD: 13 mmhg    OS: 14 mmhg  by Tonopen   Dilation:  both eyes        Medication used  [ x ] NS 2.5% [x  ]Tropicamide  [  ] Cyclogyl [  ] Cyclomydril  External:   OD:  Normal      OS:  Normal     Anterior segment exam:  By port slit lamp  Conjunctiva:  OD:  Quiet     OS:  Quiet    Cornea:    OD: Clear, no fluorescein stain      OS: Clear, no fluorescein stain      Anterior Chamber:   OD:  Deep/quiet     OS:  Deep/quiet    Iris:    OD:  Normal      OS:  Normal     Lens:    OD:  Clear        OS:  Clear         Optic  disc:  OD:  Slight tilt temporally, C:D 0.25    OS:  Slight tilt temporally, C:D 0.25  Central retina--examined with indirect ophthalmoscope:  OD:  Macula and vessels normal; media clear     OS:  Macula and vessels normal; media clear     Impression:   18yo male with history of eye pain and cyclical vomiting. No evidence of uveitis. Very low probability of acute angle closure glaucoma.  Recommendations/Plan: 1. Followup with GI as scheduled 2. Consider neurologic workup if GI not fruitful as I have some suspicion for abnormal migrainous presentation  I've discussed these findings with the  resident. Please contact our office with any questions or concerns at [phone]. Thank you for calling us to care for this nice young man and I have asked him to call immediately with new symptoms of eye pain, for immediate evaluation.  Thomes Cake, MD

## 2019-09-21 NOTE — Progress Notes (Signed)
The patient was asleep and his uncle was not in the room when I stopped by. I provided a therapy referral sheet to the nurse to share with patient and uncle at discharge.

## 2019-09-22 DIAGNOSIS — R634 Abnormal weight loss: Secondary | ICD-10-CM | POA: Diagnosis not present

## 2019-09-22 DIAGNOSIS — R112 Nausea with vomiting, unspecified: Secondary | ICD-10-CM | POA: Diagnosis not present

## 2019-09-22 DIAGNOSIS — K920 Hematemesis: Secondary | ICD-10-CM | POA: Diagnosis not present

## 2019-09-22 LAB — GLIADIN ANTIBODIES, SERUM
Antigliadin Abs, IgA: 3 units (ref 0–19)
Gliadin IgG: 2 units (ref 0–19)

## 2019-09-22 LAB — CALPROTECTIN, FECAL: Calprotectin, Fecal: 27 ug/g (ref 0–120)

## 2019-09-22 LAB — ANA: Anti Nuclear Antibody (ANA): NEGATIVE

## 2019-09-22 LAB — TISSUE TRANSGLUTAMINASE, IGA: Tissue Transglutaminase Ab, IgA: <2 U/mL (ref 0–3)

## 2019-09-22 LAB — RETICULIN ANTIBODIES, IGA W TITER: Reticulin Ab, IgA: NEGATIVE titer (ref <2.5)

## 2019-10-18 DIAGNOSIS — Z1159 Encounter for screening for other viral diseases: Secondary | ICD-10-CM | POA: Diagnosis not present

## 2019-10-21 DIAGNOSIS — D130 Benign neoplasm of esophagus: Secondary | ICD-10-CM | POA: Diagnosis not present

## 2019-10-21 DIAGNOSIS — K920 Hematemesis: Secondary | ICD-10-CM | POA: Diagnosis not present

## 2019-10-21 DIAGNOSIS — K2930 Chronic superficial gastritis without bleeding: Secondary | ICD-10-CM | POA: Diagnosis not present

## 2019-10-21 DIAGNOSIS — R634 Abnormal weight loss: Secondary | ICD-10-CM | POA: Diagnosis not present

## 2019-10-21 DIAGNOSIS — K228 Other specified diseases of esophagus: Secondary | ICD-10-CM | POA: Diagnosis not present

## 2019-10-21 DIAGNOSIS — K2970 Gastritis, unspecified, without bleeding: Secondary | ICD-10-CM | POA: Diagnosis not present

## 2019-10-21 DIAGNOSIS — R112 Nausea with vomiting, unspecified: Secondary | ICD-10-CM | POA: Diagnosis not present

## 2021-05-26 ENCOUNTER — Encounter (HOSPITAL_BASED_OUTPATIENT_CLINIC_OR_DEPARTMENT_OTHER): Payer: Self-pay | Admitting: Emergency Medicine

## 2021-05-26 ENCOUNTER — Emergency Department (HOSPITAL_BASED_OUTPATIENT_CLINIC_OR_DEPARTMENT_OTHER)
Admission: EM | Admit: 2021-05-26 | Discharge: 2021-05-26 | Disposition: A | Discharge status: Home / Self Care | Payer: Federal, State, Local not specified - PPO | Attending: Emergency Medicine | Admitting: Emergency Medicine

## 2021-05-26 DIAGNOSIS — R112 Nausea with vomiting, unspecified: Secondary | ICD-10-CM

## 2021-05-26 DIAGNOSIS — K2901 Acute gastritis with bleeding: Secondary | ICD-10-CM | POA: Insufficient documentation

## 2021-05-26 DIAGNOSIS — R55 Syncope and collapse: Secondary | ICD-10-CM

## 2021-05-26 LAB — CBC WITH DIFFERENTIAL/PLATELET
Abs Immature Granulocytes: 0.01 10*3/uL (ref 0.00–0.07)
Basophils Absolute: 0 10*3/uL (ref 0.0–0.1)
Basophils Relative: 0 %
Eosinophils Absolute: 0 10*3/uL (ref 0.0–0.5)
Eosinophils Relative: 0 %
HCT: 45.3 % (ref 39.0–52.0)
Hemoglobin: 15.4 g/dL (ref 13.0–17.0)
Immature Granulocytes: 0 %
Lymphocytes Relative: 17 %
Lymphs Abs: 1 10*3/uL (ref 0.7–4.0)
MCH: 29.6 pg (ref 26.0–34.0)
MCHC: 34 g/dL (ref 30.0–36.0)
MCV: 87.1 fL (ref 80.0–100.0)
Monocytes Absolute: 0.3 10*3/uL (ref 0.1–1.0)
Monocytes Relative: 5 %
Neutro Abs: 4.6 10*3/uL (ref 1.7–7.7)
Neutrophils Relative %: 78 %
Platelets: 216 10*3/uL (ref 150–400)
RBC: 5.2 MIL/uL (ref 4.22–5.81)
RDW: 12.3 % (ref 11.5–15.5)
WBC: 5.9 10*3/uL (ref 4.0–10.5)
nRBC: 0 % (ref 0.0–0.2)

## 2021-05-26 LAB — URINALYSIS, ROUTINE W REFLEX MICROSCOPIC
Bilirubin Urine: NEGATIVE
Glucose, UA: NEGATIVE mg/dL
Hgb urine dipstick: NEGATIVE
Leukocytes,Ua: NEGATIVE
Nitrite: NEGATIVE
Protein, ur: 30 mg/dL — AB
Specific Gravity, Urine: 1.023 (ref 1.005–1.030)
pH: 6 (ref 5.0–8.0)

## 2021-05-26 LAB — COMPREHENSIVE METABOLIC PANEL
ALT: 10 U/L (ref 0–44)
AST: 13 U/L — ABNORMAL LOW (ref 15–41)
Albumin: 4.7 g/dL (ref 3.5–5.0)
Alkaline Phosphatase: 77 U/L (ref 38–126)
Anion gap: 8 (ref 5–15)
BUN: 7 mg/dL (ref 6–20)
CO2: 28 mmol/L (ref 22–32)
Calcium: 9.9 mg/dL (ref 8.9–10.3)
Chloride: 105 mmol/L (ref 98–111)
Creatinine, Ser: 0.75 mg/dL (ref 0.61–1.24)
GFR, Estimated: >60 mL/min (ref >60)
Glucose, Bld: 112 mg/dL — ABNORMAL HIGH (ref 70–99)
Potassium: 4 mmol/L (ref 3.5–5.1)
Sodium: 141 mmol/L (ref 135–145)
Total Bilirubin: 0.8 mg/dL (ref 0.3–1.2)
Total Protein: 6.9 g/dL (ref 6.5–8.1)

## 2021-05-26 LAB — LIPASE, BLOOD: Lipase: 11 U/L (ref 11–51)

## 2021-05-26 MED ORDER — PANTOPRAZOLE SODIUM 20 MG PO TBEC: 40.0000 mg | DELAYED_RELEASE_TABLET | Freq: Every day | ORAL | 0 refills | Status: DC

## 2021-05-26 MED ORDER — ONDANSETRON HCL 4 MG/2ML IJ SOLN
4.0000 mg | Freq: Once | INTRAMUSCULAR | Status: AC
  Administered 2021-05-26: 4 mg via INTRAVENOUS
  Filled 2021-05-26: qty 2

## 2021-05-26 MED ORDER — PANTOPRAZOLE SODIUM 40 MG IV SOLR
40.0000 mg | Freq: Once | INTRAVENOUS | Status: AC
  Administered 2021-05-26: 40 mg via INTRAVENOUS
  Filled 2021-05-26: qty 40

## 2021-05-26 MED ORDER — ONDANSETRON 4 MG PO TBDP: 4.0000 mg | ORAL_TABLET | Freq: Three times a day (TID) | ORAL | 0 refills | Status: DC | PRN

## 2021-05-26 MED ORDER — SODIUM CHLORIDE 0.9 % IV BOLUS
1000.0000 mL | Freq: Once | INTRAVENOUS | Status: AC
  Administered 2021-05-26: 1000 mL via INTRAVENOUS

## 2021-05-26 NOTE — ED Notes (Signed)
Pt endorses daily vaping, marijuana use 3 times a week (not any this week).denies alcohol

## 2021-05-26 NOTE — ED Provider Notes (Signed)
[facility] EMERGENCY DEPT Provider Note   CSN: [id number] Arrival date & time: 05/26/21  2336     History Chief Complaint  Patient presents with   Abdominal Pain    Stephen Underwood is a 20 y.o. male.  HPI     20 year old male with history or admission at age 73 for weight loss with work up including labs, MRI brain, consideration of marijuana use/undiagnosed anxiety contributing to symptoms, and per pt history of ulcer diagnosis with [facility], previously on PPI with continued n/v/epigastric pain over last 2-3 years who presents with concern for nausea, vomiting and syncope.  He reports he has had daily nausea and vomiting over the last 2 to 3 years.  Has had associated epigastric abdominal pain which has also been present for years.  Reports he had 2 episodes of vomiting, and today the episode he had at work was worse than usual.  He also reports daily hematemesis, vomiting small amounts of bright red blood or clots daily. Occasional dark stool. Reports one loose stool 2 days ago, no BM since then.  No etoh, occ NSAID, continues to use marijuana for anxiety.  He has not continued to follow with GI.  Today, after the episode of vomiting, he went to work meeting and remembers looking at supervisor, feeling lightheaded and nauseas and then doesn't remember. Had syncopal event.  No loss of bowel.blader/no reported seizure like activity.  No fever, cough, chest pain, dyspnea. No hx of syncope. Has had urinary frequency. No penile discharge, not sexually active or concern for exposure.    Past Medical History:  Diagnosis Date   History of tympanostomy tube placement 2003   Mild to moderate hearing loss     Patient Active Problem List   Diagnosis Date Noted   Moderate malnutrition (HCC) 09/21/2019   Poor appetite 09/21/2019   Weight loss 09/20/2019   Vomiting 09/20/2019   Vision changes 09/20/2019    History reviewed. No pertinent surgical history.     Family History   Problem Relation Age of Onset   Anemia Mother    Depression Mother    Heart murmur Father    ADD / ADHD Father    Epilepsy Father    Peptic Ulcer Father    Bipolar disorder Father    Ovarian cancer Maternal Grandmother    Hypertension Paternal Grandfather    Liver disease Paternal Grandfather    Heart disease Paternal Grandfather     Social History   Tobacco Use   Smoking status: Never   Smokeless tobacco: Never  Vaping Use   Vaping Use: Every day   Start date: 11/18/2018   Substances: Nicotine, CBD   Devices: Vape, e-juice  Substance Use Topics   Alcohol use: Not Currently    Comment: 1-2 times per month   Drug use: Yes    Frequency: 3.0 times per week    Types: Marijuana    Comment: none this am    Home Medications Prior to Admission medications   Medication Sig Start Date End Date Taking? Authorizing Provider  ondansetron (ZOFRAN ODT) 4 MG disintegrating tablet Take 1 tablet (4 mg total) by mouth every 8 (eight) hours as needed for nausea or vomiting. 05/26/21  Yes Alvira Monday, MD  pantoprazole (PROTONIX) 20 MG tablet Take 2 tablets (40 mg total) by mouth daily for 14 days. 05/26/21 06/09/21 Yes Alvira Monday, MD    Allergies    Patient has no known allergies.  Review of Systems  Review of Systems  Constitutional:  Negative for fever.  HENT:  Negative for sore throat.   Eyes:  Negative for visual disturbance.  Respiratory:  Negative for shortness of breath.   Cardiovascular:  Negative for chest pain.  Gastrointestinal:  Positive for abdominal pain, nausea and vomiting. Diarrhea: at times but no BM in 2 days. Genitourinary:  Positive for decreased urine volume and frequency. Negative for difficulty urinating.  Musculoskeletal:  Negative for back pain and neck stiffness.  Skin:  Negative for rash.  Neurological:  Positive for syncope. Negative for weakness, numbness and headaches.   Physical Exam Updated Vital Signs BP 112/79 (BP Location: Left Arm)    Pulse 67   Temp 98.9 F (37.2 C) (Oral)   Resp (!) 21   SpO2 100%   Physical Exam Vitals and nursing note reviewed.  Constitutional:      General: He is not in acute distress.    Appearance: He is well-developed. He is not diaphoretic.  HENT:     Head: Normocephalic and atraumatic.  Eyes:     Conjunctiva/sclera: Conjunctivae normal.  Cardiovascular:     Rate and Rhythm: Normal rate and regular rhythm.     Heart sounds: Normal heart sounds. No murmur heard.   No friction rub. No gallop.  Pulmonary:     Effort: Pulmonary effort is normal. No respiratory distress.     Breath sounds: Normal breath sounds. No wheezing or rales.  Abdominal:     General: There is no distension.     Palpations: Abdomen is soft.     Tenderness: There is no abdominal tenderness. There is no guarding.  Musculoskeletal:     Cervical back: Normal range of motion.  Skin:    General: Skin is warm and dry.  Neurological:     Mental Status: He is alert and oriented to person, place, and time.    ED Results / Procedures / Treatments   Labs (all labs ordered are listed, but only abnormal results are displayed) Labs Reviewed  COMPREHENSIVE METABOLIC PANEL - Abnormal; Notable for the following components:      Result Value   Glucose, Bld 112 (*)    AST 13 (*)    All other components within normal limits  URINALYSIS, ROUTINE W REFLEX MICROSCOPIC - Abnormal; Notable for the following components:   Ketones, ur TRACE (*)    Protein, ur 30 (*)    All other components within normal limits  CBC WITH DIFFERENTIAL/PLATELET  LIPASE, BLOOD    EKG None  Radiology No results found.  Procedures Procedures   Medications Ordered in ED Medications  sodium chloride 0.9 % bolus 1,000 mL (0 mLs Intravenous Stopped 05/26/21 0902)  ondansetron (ZOFRAN) injection 4 mg (4 mg Intravenous Given 05/26/21 0801)  pantoprazole (PROTONIX) injection 40 mg (40 mg Intravenous Given 05/26/21 0801)    ED Course  I have reviewed  the triage vital signs and the nursing notes.  Pertinent labs & imaging results that were available during my care of the patient were reviewed by me and considered in my medical decision making (see chart for details).    MDM Rules/Calculators/A&P                           20 year old male with history or admission at age 89 for weight loss with work up including labs, MRI brain, consideration of marijuana use/undiagnosed anxiety contributing to symptoms, and per pt history of ulcer  diagnosis with [facility], previously on PPI with continued n/v/epigastric pain over last 2-3 years who presents with concern for nausea, vomiting and syncope.    EKG evaluated by me and shows sinus rhythm with no sign of prolonged QTc, no brugada, no sign of HOCM, no ST abnormalities.  No chest pain or dyspnea, doubt PE.   Has had chronic daily hematemesis, with hemoglobin 15.4. Do not suspect acute GI bleed or hemorrhagic shock as etiology of syncope.  No significant electrolyte abnormalities. Suspect likely vasovagal episode in setting of nausea, vomiting.  Has had chronic continuing symptoms of epigastric abdominal pain, recommend reinitiation of PPI, given rx for pantoprazole and zofran. Has see [facility] but is interested in seeing a different practice and notes it has been years.    Final Clinical Impression(s) / ED Diagnoses Final diagnoses:  Syncope, unspecified syncope type  Non-intractable vomiting with nausea, unspecified vomiting type  Acute gastritis with hemorrhage, unspecified gastritis type    Rx / DC Orders ED Discharge Orders          Ordered    pantoprazole (PROTONIX) 20 MG tablet  Daily        05/26/21 0839    ondansetron (ZOFRAN ODT) 4 MG disintegrating tablet  Every 8 hours PRN        05/26/21 1324             Alvira Monday, MD 05/26/21 2250

## 2021-05-26 NOTE — ED Triage Notes (Signed)
Pt diagnosed with ulcers and esophageal tear about 2 years ago. Pt reports MD tried reflux meds,did not help and MD said he didn't have anything else to offer patient. Pt's symptoms of burning in his stomach and sore throat, vomiting have returned. Pt was at work this am and passed out for approximately 5 minutes. Pt reports vomiting this am a quarter size blood clot.

## 2021-05-26 NOTE — ED Notes (Signed)
PVR 13 cc. Pt void 15 cc.

## 2021-07-03 ENCOUNTER — Emergency Department (HOSPITAL_BASED_OUTPATIENT_CLINIC_OR_DEPARTMENT_OTHER)
Admission: EM | Admit: 2021-07-03 | Discharge: 2021-07-03 | Disposition: A | Discharge status: Home / Self Care | Payer: Federal, State, Local not specified - PPO | Attending: Emergency Medicine | Admitting: Emergency Medicine

## 2021-07-03 ENCOUNTER — Other Ambulatory Visit: Payer: Self-pay

## 2021-07-03 ENCOUNTER — Encounter (HOSPITAL_BASED_OUTPATIENT_CLINIC_OR_DEPARTMENT_OTHER): Payer: Self-pay | Admitting: Emergency Medicine

## 2021-07-03 DIAGNOSIS — G8929 Other chronic pain: Secondary | ICD-10-CM | POA: Diagnosis not present

## 2021-07-03 DIAGNOSIS — K920 Hematemesis: Secondary | ICD-10-CM | POA: Diagnosis not present

## 2021-07-03 DIAGNOSIS — R109 Unspecified abdominal pain: Secondary | ICD-10-CM | POA: Insufficient documentation

## 2021-07-03 LAB — COMPREHENSIVE METABOLIC PANEL
ALT: 10 U/L (ref 0–44)
AST: 16 U/L (ref 15–41)
Albumin: 5.1 g/dL — ABNORMAL HIGH (ref 3.5–5.0)
Alkaline Phosphatase: 84 U/L (ref 38–126)
Anion gap: 14 (ref 5–15)
BUN: 6 mg/dL (ref 6–20)
CO2: 27 mmol/L (ref 22–32)
Calcium: 10.4 mg/dL — ABNORMAL HIGH (ref 8.9–10.3)
Chloride: 101 mmol/L (ref 98–111)
Creatinine, Ser: 0.8 mg/dL (ref 0.61–1.24)
GFR, Estimated: >60 mL/min (ref >60)
Glucose, Bld: 100 mg/dL — ABNORMAL HIGH (ref 70–99)
Potassium: 3.7 mmol/L (ref 3.5–5.1)
Sodium: 142 mmol/L (ref 135–145)
Total Bilirubin: 1 mg/dL (ref 0.3–1.2)
Total Protein: 7.8 g/dL (ref 6.5–8.1)

## 2021-07-03 LAB — CBC WITH DIFFERENTIAL/PLATELET
Abs Immature Granulocytes: 0.03 10*3/uL (ref 0.00–0.07)
Basophils Absolute: 0 10*3/uL (ref 0.0–0.1)
Basophils Relative: 0 %
Eosinophils Absolute: 0 10*3/uL (ref 0.0–0.5)
Eosinophils Relative: 0 %
HCT: 51.2 % (ref 39.0–52.0)
Hemoglobin: 16.9 g/dL (ref 13.0–17.0)
Immature Granulocytes: 0 %
Lymphocytes Relative: 20 %
Lymphs Abs: 2.1 10*3/uL (ref 0.7–4.0)
MCH: 28.8 pg (ref 26.0–34.0)
MCHC: 33 g/dL (ref 30.0–36.0)
MCV: 87.4 fL (ref 80.0–100.0)
Monocytes Absolute: 0.4 10*3/uL (ref 0.1–1.0)
Monocytes Relative: 4 %
Neutro Abs: 7.9 10*3/uL — ABNORMAL HIGH (ref 1.7–7.7)
Neutrophils Relative %: 76 %
Platelets: 263 10*3/uL (ref 150–400)
RBC: 5.86 MIL/uL — ABNORMAL HIGH (ref 4.22–5.81)
RDW: 12.9 % (ref 11.5–15.5)
WBC: 10.5 10*3/uL (ref 4.0–10.5)
nRBC: 0 % (ref 0.0–0.2)

## 2021-07-03 LAB — LIPASE, BLOOD: Lipase: 15 U/L (ref 11–51)

## 2021-07-03 MED ORDER — PANTOPRAZOLE SODIUM 40 MG IV SOLR
40.0000 mg | Freq: Once | INTRAVENOUS | Status: AC
  Administered 2021-07-03: 40 mg via INTRAVENOUS
  Filled 2021-07-03: qty 40

## 2021-07-03 MED ORDER — ONDANSETRON HCL 4 MG/2ML IJ SOLN
4.0000 mg | Freq: Once | INTRAMUSCULAR | Status: AC
  Administered 2021-07-03: 4 mg via INTRAVENOUS
  Filled 2021-07-03: qty 2

## 2021-07-03 MED ORDER — PANTOPRAZOLE SODIUM 20 MG PO TBEC: 40.0000 mg | DELAYED_RELEASE_TABLET | Freq: Every day | ORAL | 0 refills | Status: DC

## 2021-07-03 MED ORDER — SODIUM CHLORIDE 0.9 % IV BOLUS
1000.0000 mL | Freq: Once | INTRAVENOUS | Status: AC
  Administered 2021-07-03: 1000 mL via INTRAVENOUS

## 2021-07-03 NOTE — Discharge Instructions (Signed)
Recommend taking the prescribed medicine.  Please follow-up with your gastroenterologist and your primary care doctor.  If you have any episodes of blood in vomit, passing out or other new concerning symptom, come back to ER for reassessment.

## 2021-07-03 NOTE — ED Provider Notes (Signed)
[facility] EMERGENCY DEPT Provider Note   CSN: [id number] Arrival date & time: 07/03/21  1241     History Chief Complaint  Patient presents with   Hematemesis    Stephen Underwood is a 20 y.o. male.  Presents to ER with concern for episode of hematemesis.  Patient states he has chronic abdominal pain, states that every morning he wakes up with abdominal pain and frequently has associated vomiting.  Says that occasionally he notices a tiny amount of blood in his vomit but normally does not have vomit with blood.  This morning he had his usual abdominal pain and episode of nausea followed by an episode of blood in vomit.  States he has not had any additional episodes of vomiting and states that the nausea has improved.  No abdominal pain at present.  Patient also states that for the last couple years he has had episodes where he feels like he spaces out, losing track of time.  Does not have loss of consciousness.  Does not fall.  States that it is more of a mental fog.  Episodes happen about once a week.  No change in the symptoms today.  No bladder or bowel incontinence, no seizure activity.  HPI     Past Medical History:  Diagnosis Date   History of tympanostomy tube placement 2003   Mild to moderate hearing loss     Patient Active Problem List   Diagnosis Date Noted   Moderate malnutrition (HCC) 09/21/2019   Poor appetite 09/21/2019   Weight loss 09/20/2019   Vomiting 09/20/2019   Vision changes 09/20/2019    History reviewed. No pertinent surgical history.     Family History  Problem Relation Age of Onset   Anemia Mother    Depression Mother    Heart murmur Father    ADD / ADHD Father    Epilepsy Father    Peptic Ulcer Father    Bipolar disorder Father    Ovarian cancer Maternal Grandmother    Hypertension Paternal Grandfather    Liver disease Paternal Grandfather    Heart disease Paternal Grandfather     Social History   Tobacco Use   Smoking  status: Never   Smokeless tobacco: Never  Vaping Use   Vaping Use: Every day   Start date: 11/18/2018   Substances: Nicotine, CBD   Devices: Vape, e-juice  Substance Use Topics   Alcohol use: Not Currently    Comment: 1-2 times per month   Drug use: Yes    Frequency: 3.0 times per week    Types: Marijuana    Comment: none this am    Home Medications Prior to Admission medications   Medication Sig Start Date End Date Taking? Authorizing Provider  ondansetron (ZOFRAN ODT) 4 MG disintegrating tablet Take 1 tablet (4 mg total) by mouth every 8 (eight) hours as needed for nausea or vomiting. 05/26/21  Yes Alvira Monday, MD  pantoprazole (PROTONIX) 20 MG tablet Take 2 tablets (40 mg total) by mouth daily for 14 days. 05/26/21 06/09/21  Alvira Monday, MD    Allergies    Patient has no known allergies.  Review of Systems   Review of Systems  Constitutional:  Positive for fatigue. Negative for chills and fever.  HENT:  Negative for ear pain and sore throat.   Eyes:  Negative for pain and visual disturbance.  Respiratory:  Negative for cough and shortness of breath.   Cardiovascular:  Negative for chest pain and palpitations.  Gastrointestinal:  Positive for abdominal pain, nausea and vomiting.  Genitourinary:  Negative for dysuria and hematuria.  Musculoskeletal:  Negative for arthralgias and back pain.  Skin:  Negative for color change and rash.  Neurological:  Negative for seizures and syncope.  All other systems reviewed and are negative.  Physical Exam Updated Vital Signs BP (!) 144/105 (BP Location: Right Arm)   Pulse 83   Temp 98.5 F (36.9 C) (Oral)   Resp 18   Ht 5\' 10"  (1.778 m)   Wt 62.6 kg   SpO2 99%   BMI 19.80 kg/m   Physical Exam Vitals and nursing note reviewed.  Constitutional:      Appearance: He is well-developed.  HENT:     Head: Normocephalic and atraumatic.  Eyes:     Conjunctiva/sclera: Conjunctivae normal.  Cardiovascular:     Rate and  Rhythm: Normal rate and regular rhythm.     Heart sounds: No murmur heard. Pulmonary:     Effort: Pulmonary effort is normal. No respiratory distress.     Breath sounds: Normal breath sounds.  Abdominal:     Palpations: Abdomen is soft.     Tenderness: There is no abdominal tenderness.  Musculoskeletal:     Cervical back: Neck supple.  Skin:    General: Skin is warm and dry.  Neurological:     General: No focal deficit present.     Mental Status: He is alert.  Psychiatric:        Mood and Affect: Mood normal.        Behavior: Behavior normal.    ED Results / Procedures / Treatments   Labs (all labs ordered are listed, but only abnormal results are displayed) Labs Reviewed  CBC WITH DIFFERENTIAL/PLATELET - Abnormal; Notable for the following components:      Result Value   RBC 5.86 (*)    Neutro Abs 7.9 (*)    All other components within normal limits  COMPREHENSIVE METABOLIC PANEL - Abnormal; Notable for the following components:   Glucose, Bld 100 (*)    Calcium 10.4 (*)    Albumin 5.1 (*)    All other components within normal limits  LIPASE, BLOOD    EKG EKG Interpretation  Date/Time:  Tuesday July 03 2021 13:17:15 EDT Ventricular Rate:  67 PR Interval:  118 QRS Duration: 106 QT Interval:  383 QTC Calculation: 405 R Axis:   93 Text Interpretation: Sinus rhythm Borderline short PR interval Borderline right axis deviation Early repolarization, probably normal variant no acute change from prior ecg Confirmed by [id number] (Marianna Fuss) on 07/03/2021 1:23:01 PM  Radiology No results found.  Procedures Procedures   Medications Ordered in ED Medications  pantoprazole (PROTONIX) injection 40 mg (40 mg Intravenous Given 07/03/21 1322)  ondansetron (ZOFRAN) injection 4 mg (4 mg Intravenous Given 07/03/21 1322)  sodium chloride 0.9 % bolus 1,000 mL (1,000 mLs Intravenous New Bag/Given 07/03/21 1322)    ED Course  I have reviewed the triage vital signs and the  nursing notes.  Pertinent labs & imaging results that were available during my care of the patient were reviewed by me and considered in my medical decision making (see chart for details).    MDM Rules/Calculators/A&P                           20 year old presents to ER with initial chief complaint of episode hematemesis.  Patient states he struggles from chronic abdominal pain and  chronic nausea.  In ER he appears well in no acute distress.  No additional episodes of nausea or vomiting.  All his labs are grossly normal.  Recommend patient take PPI and follow-up with his gastroenterologist.  Patient also endorses having episodes of spacing out randomly during the day.  Basic labs are stable, EKG unremarkable, no events on telemetry monitoring.  Recommend he follow-up with his primary doctor. Ok for [employer] now.    After the discussed management above, the patient was determined to be safe for discharge.  The patient was in agreement with this plan and all questions regarding their care were answered.  ED return precautions were discussed and the patient will return to the ED with any significant worsening of condition.  Final Clinical Impression(s) / ED Diagnoses Final diagnoses:  None    Rx / DC Orders ED Discharge Orders     None        Milagros Loll, MD 07/03/21 1416

## 2021-07-03 NOTE — ED Triage Notes (Signed)
Pt has a Hx of stomach ulcers. This morning Pt had 2 episodes of Hematemesis.   Pt denies taking NSAIDS

## 2021-07-03 NOTE — ED Notes (Signed)
Pt added that after throwing up and felt that he was about to pass out. Pt states that he has also had problems with losing track of time for 2 to 3 minutes. Pt described it as staring out in space and blacking out. Pt stated that he does not fall when this happens but remains upright.

## 2021-07-26 IMAGING — DX DG ABDOMEN 1V
1 series · 1 of 1 positions shown · non-contrast
Comparison: Upper GI series 07/06/2019

CLINICAL DATA: Persistent vomiting

EXAM:
ABDOMEN - 1 VIEW

[abdomen]
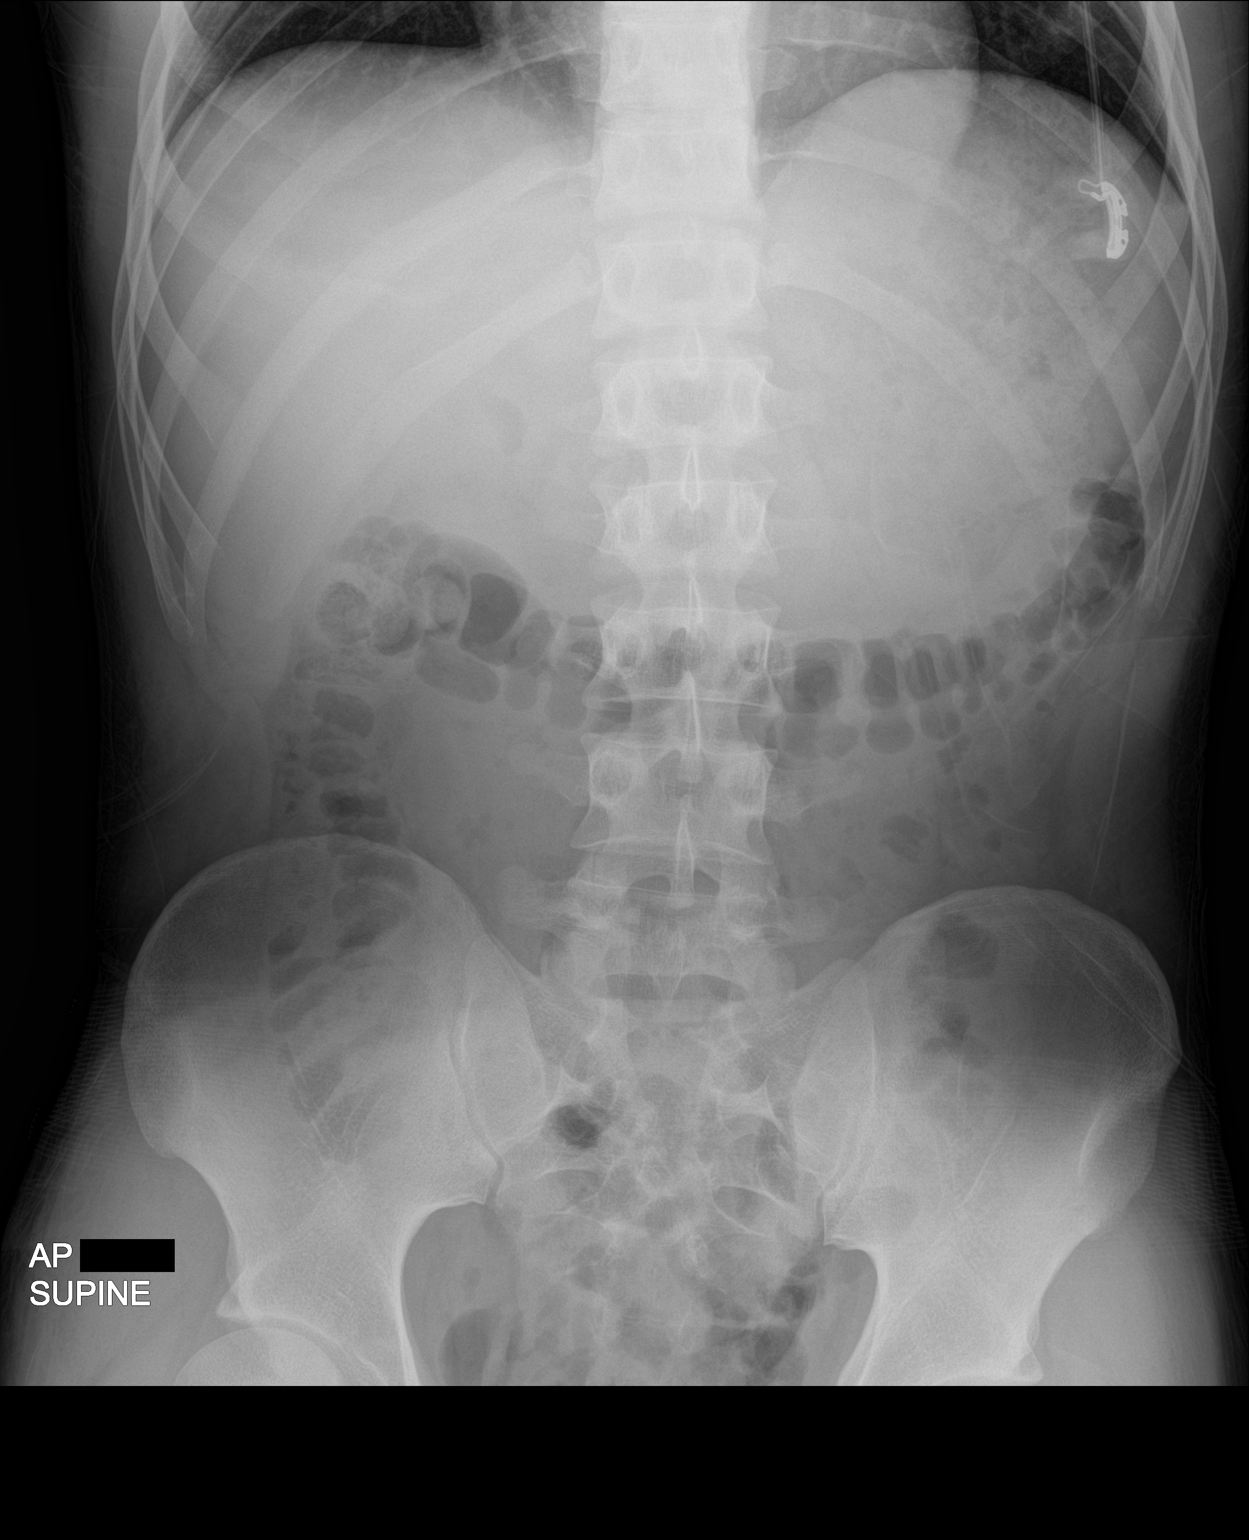

[1 of 1 positions shown; findings below may reference images not displayed]

FINDINGS: The bowel gas pattern is normal. No radio-opaque calculi or other
significant radiographic abnormality are seen.
IMPRESSION: Negative.

## 2021-07-27 IMAGING — MR MR HEAD WO/W CM
14 of 16 series · 40 of 48 positions shown · IV contrast (Gadavist)
Comparison: None.

CLINICAL DATA: Initial evaluation for headache with vomiting for
approximately 1 year, increasing in frequency.

EXAM:
MRI HEAD WITHOUT AND WITH CONTRAST
TECHNIQUE: Multiplanar, multiecho pulse sequences of the brain and surrounding
structures were obtained without and with intravenous contrast.
CONTRAST:  6.3mL GADAVIST GADOBUTROL 1 MMOL/ML IV SOLN

[Series 5: DWI · axial · 3.0mm · 0.88mm/px · z∈[-59,+74]mm · 5 of 94 slices shown (1 of 4)]
[im 1/94]
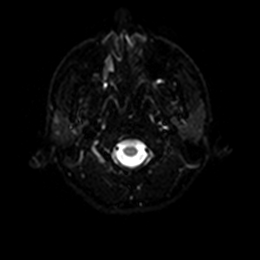
[im 24/94]
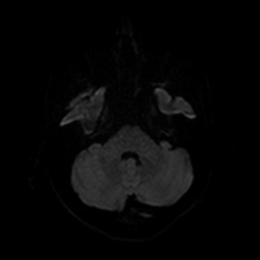
[im 47/94]
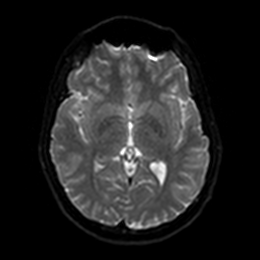
[im 70/94]
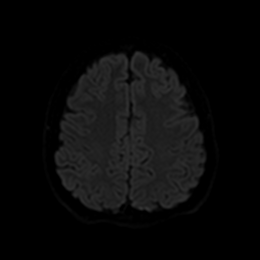
[im 94/94]
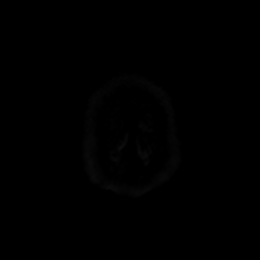

[Series 6: DWI · axial · 3.0mm · 0.88mm/px · z∈[-59,+74]mm · 2 of 45 slices shown (2 of 4)]
[im 1/45]
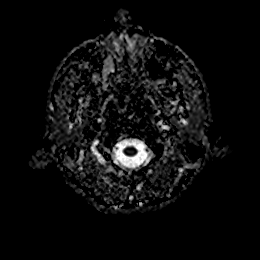
[im 45/45]
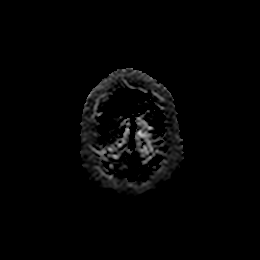

[Series 7: DWI · coronal · 4.0mm · 0.88mm/px · 4 of 72 slices shown (3 of 4)]
[im 1/72]
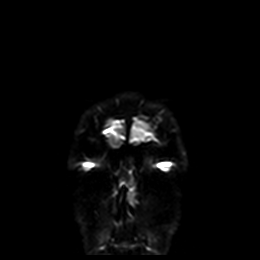
[im 24/72]
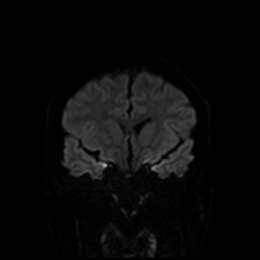
[im 48/72]
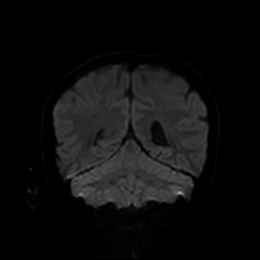
[im 72/72]
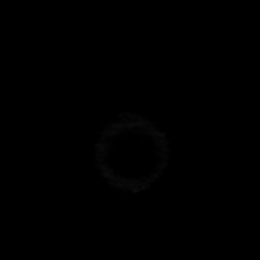

[Series 8: DWI · coronal · 4.0mm · 0.88mm/px · 2 of 36 slices shown (4 of 4)]
[im 1/36]
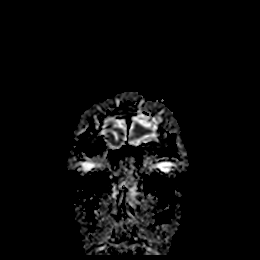
[im 36/36]
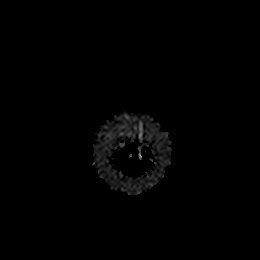

[Series 9: T1 · sagittal · 5.0mm · 0.75mm/px · 2 of 24 slices shown]
[im 1/24]
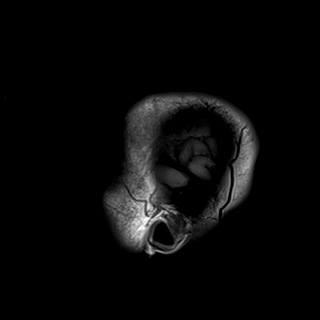
[im 24/24]
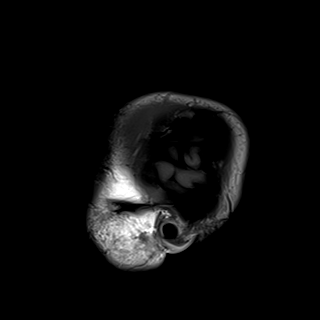

[Series 10: T2 · axial · 5.0mm · 0.72mm/px · z∈[-64,+75]mm · 2 of 25 slices shown]
[im 1/25]
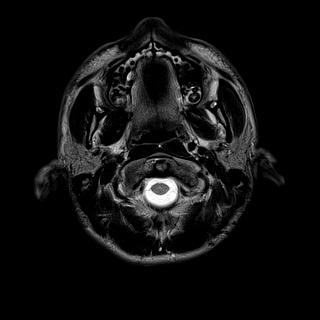
[im 25/25]
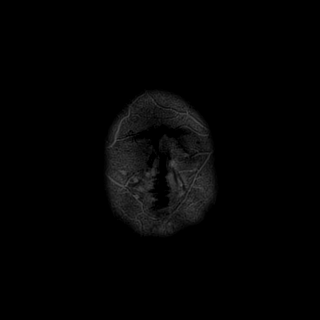

[Series 11: FLAIR · axial · 5.0mm · 0.45mm/px · z∈[-64,+75]mm · 2 of 25 slices shown]
[im 1/25]
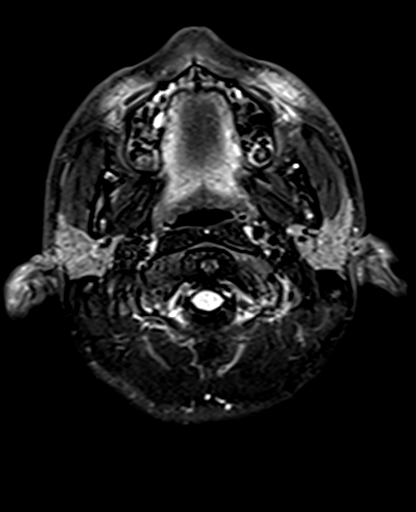
[im 25/25]
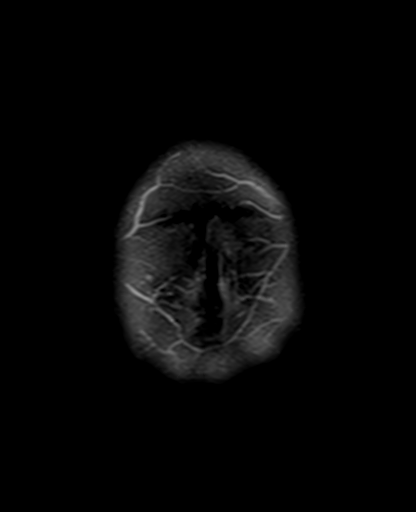

[Series 12: mag_images · axial · 3.0mm · 0.90mm/px · z∈[-72,+99]mm · 4 of 60 slices shown]
[im 1/60]
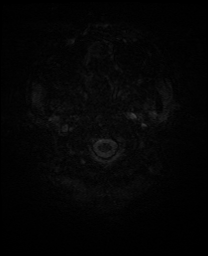
[im 20/60]
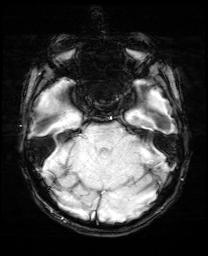
[im 40/60]
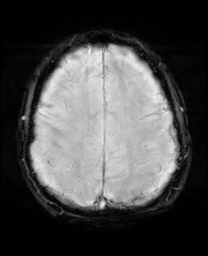
[im 60/60]
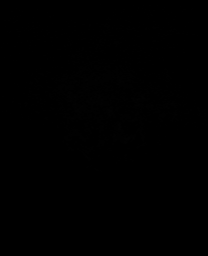

[Series 13: pha_images · axial · 3.0mm · 0.90mm/px · z∈[-72,+84]mm · 4 of 55 slices shown]
[im 1/55]
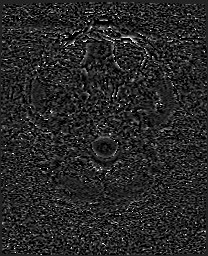
[im 19/55]
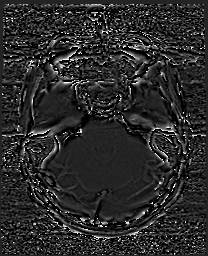
[im 37/55]
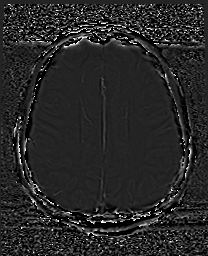
[im 55/55]
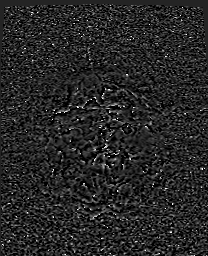

[Series 14: swi_images · axial · 3.0mm · 0.90mm/px · z∈[-72,+99]mm · 4 of 60 slices shown]
[im 1/60]
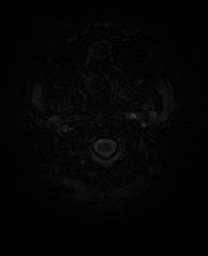
[im 20/60]
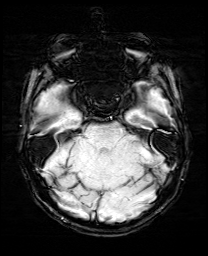
[im 40/60]
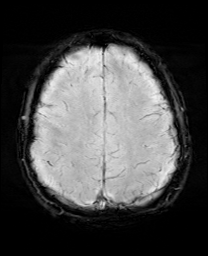
[im 60/60]
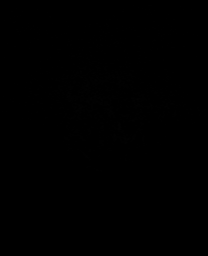

[Series 15: mip_images(sw) · axial · 24.0mm · 0.90mm/px · z∈[-62,+89]mm · 3 of 53 slices shown]
[im 1/53]
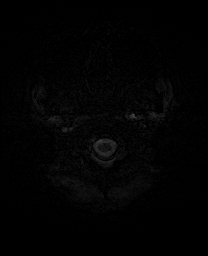
[im 27/53]
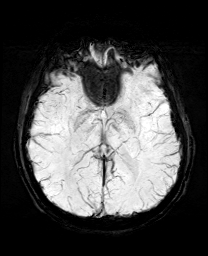
[im 53/53]
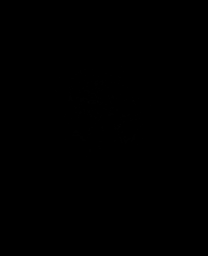

[Series 17: T2 post-contrast · coronal · 5.0mm · 0.72mm/px · 2 of 30 slices shown]
[im 1/30]
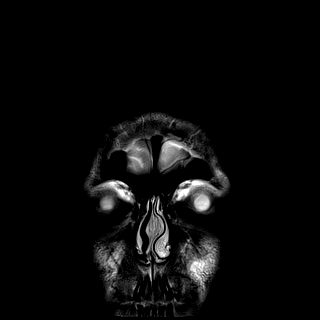
[im 30/30]
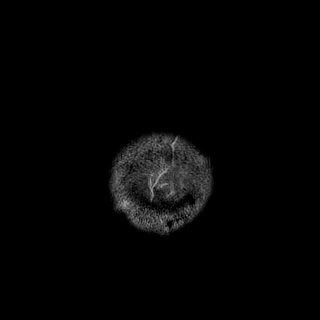

[Series 19: T1 post-contrast · coronal · 5.0mm · 0.34mm/px · 2 of 30 slices shown (1 of 2)]
[im 1/30]
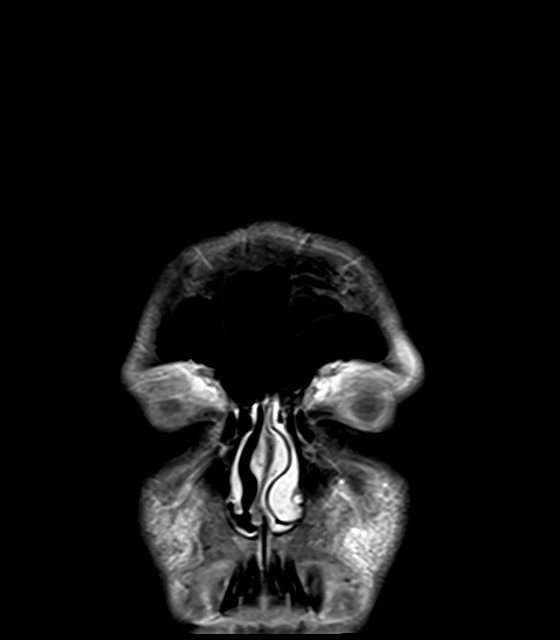
[im 30/30]
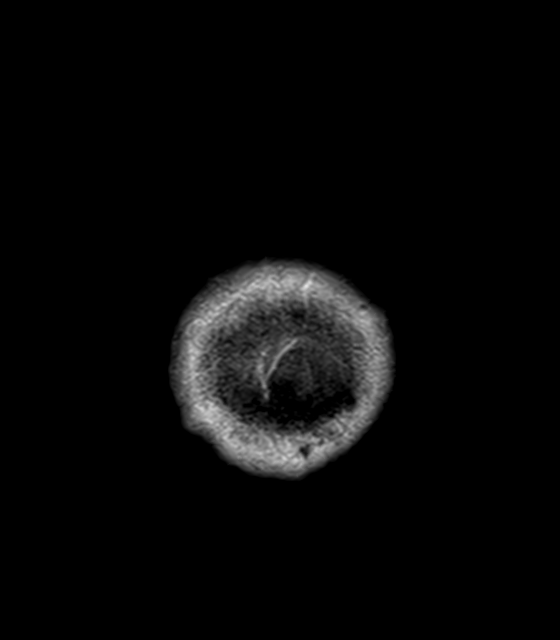

[Series 20: T1 post-contrast · sagittal · 5.0mm · 0.72mm/px · 2 of 25 slices shown (2 of 2)]
[im 1/25]
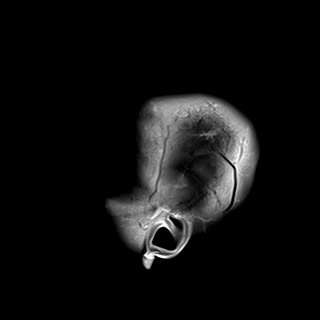
[im 25/25]
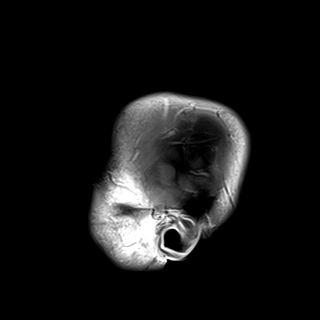

[40 of 48 positions shown; findings below may reference images not displayed]

FINDINGS: Brain: Cerebral volume within normal limits for patient age. No
focal parenchymal signal abnormality identified.

No abnormal foci of restricted diffusion to suggest acute or
subacute ischemia. Gray-white matter differentiation well
maintained. No encephalomalacia to suggest chronic infarction. No
foci of susceptibility artifact to suggest acute or chronic
intracranial hemorrhage.

No mass lesion, midline shift or mass effect. No hydrocephalus. No
extra-axial fluid collection. Major dural sinuses are grossly
patent.

Pituitary gland and suprasellar region are normal. Midline
structures intact and normal.

No abnormal enhancement. Incidental note made of a small DVA within
the anterior right frontal lobe.

Vascular: Major intracranial vascular flow voids well maintained and
normal in appearance.

Skull and upper cervical spine: Craniocervical junction normal.
Visualized upper cervical spine within normal limits. Bone marrow
signal intensity normal. No scalp soft tissue abnormality.

Sinuses/Orbits: Globes and orbital soft tissues within normal
limits.

Mild scattered mucosal thickening noted within the ethmoidal air
cells. Paranasal sinuses are otherwise clear. No mastoid effusion.
Inner ear structures normal.

Other: None.
IMPRESSION: Normal brain MRI. No findings to explain patient's symptoms
identified.

## 2021-11-03 ENCOUNTER — Encounter (HOSPITAL_COMMUNITY): Payer: Self-pay | Admitting: *Deleted

## 2021-11-03 ENCOUNTER — Emergency Department (HOSPITAL_COMMUNITY)
Admission: EM | Admit: 2021-11-03 | Discharge: 2021-11-03 | Disposition: A | Discharge status: Home / Self Care | Payer: Federal, State, Local not specified - PPO | Attending: Emergency Medicine | Admitting: Emergency Medicine

## 2021-11-03 ENCOUNTER — Other Ambulatory Visit: Payer: Self-pay

## 2021-11-03 DIAGNOSIS — Y906 Blood alcohol level of 120-199 mg/100 ml: Secondary | ICD-10-CM | POA: Diagnosis not present

## 2021-11-03 DIAGNOSIS — R569 Unspecified convulsions: Secondary | ICD-10-CM | POA: Diagnosis not present

## 2021-11-03 DIAGNOSIS — F10129 Alcohol abuse with intoxication, unspecified: Secondary | ICD-10-CM | POA: Insufficient documentation

## 2021-11-03 DIAGNOSIS — R4182 Altered mental status, unspecified: Secondary | ICD-10-CM | POA: Diagnosis not present

## 2021-11-03 DIAGNOSIS — F10929 Alcohol use, unspecified with intoxication, unspecified: Secondary | ICD-10-CM | POA: Diagnosis not present

## 2021-11-03 DIAGNOSIS — R000 Tachycardia, unspecified: Secondary | ICD-10-CM | POA: Diagnosis not present

## 2021-11-03 DIAGNOSIS — R404 Transient alteration of awareness: Secondary | ICD-10-CM | POA: Diagnosis not present

## 2021-11-03 DIAGNOSIS — F10920 Alcohol use, unspecified with intoxication, uncomplicated: Secondary | ICD-10-CM

## 2021-11-03 LAB — RAPID URINE DRUG SCREEN, HOSP PERFORMED
Amphetamines: NOT DETECTED
Barbiturates: NOT DETECTED
Benzodiazepines: NOT DETECTED
Cocaine: NOT DETECTED
Opiates: NOT DETECTED
Tetrahydrocannabinol: POSITIVE — AB

## 2021-11-03 LAB — CBC WITH DIFFERENTIAL/PLATELET
Abs Immature Granulocytes: 0.02 10*3/uL (ref 0.00–0.07)
Basophils Absolute: 0.1 10*3/uL (ref 0.0–0.1)
Basophils Relative: 1 %
Eosinophils Absolute: 0 10*3/uL (ref 0.0–0.5)
Eosinophils Relative: 1 %
HCT: 44.7 % (ref 39.0–52.0)
Hemoglobin: 15.2 g/dL (ref 13.0–17.0)
Immature Granulocytes: 0 %
Lymphocytes Relative: 24 %
Lymphs Abs: 1.8 10*3/uL (ref 0.7–4.0)
MCH: 29.5 pg (ref 26.0–34.0)
MCHC: 34 g/dL (ref 30.0–36.0)
MCV: 86.6 fL (ref 80.0–100.0)
Monocytes Absolute: 0.5 10*3/uL (ref 0.1–1.0)
Monocytes Relative: 7 %
Neutro Abs: 4.9 10*3/uL (ref 1.7–7.7)
Neutrophils Relative %: 67 %
Platelets: 202 10*3/uL (ref 150–400)
RBC: 5.16 MIL/uL (ref 4.22–5.81)
RDW: 12.3 % (ref 11.5–15.5)
WBC: 7.3 10*3/uL (ref 4.0–10.5)
nRBC: 0 % (ref 0.0–0.2)

## 2021-11-03 LAB — COMPREHENSIVE METABOLIC PANEL
ALT: 13 U/L (ref 0–44)
AST: 17 U/L (ref 15–41)
Albumin: 4.3 g/dL (ref 3.5–5.0)
Alkaline Phosphatase: 78 U/L (ref 38–126)
Anion gap: 11 (ref 5–15)
BUN: 6 mg/dL (ref 6–20)
CO2: 25 mmol/L (ref 22–32)
Calcium: 8.9 mg/dL (ref 8.9–10.3)
Chloride: 105 mmol/L (ref 98–111)
Creatinine, Ser: 0.99 mg/dL (ref 0.61–1.24)
GFR, Estimated: >60 mL/min (ref >60)
Glucose, Bld: 110 mg/dL — ABNORMAL HIGH (ref 70–99)
Potassium: 3.3 mmol/L — ABNORMAL LOW (ref 3.5–5.1)
Sodium: 141 mmol/L (ref 135–145)
Total Bilirubin: 0.9 mg/dL (ref 0.3–1.2)
Total Protein: 7 g/dL (ref 6.5–8.1)

## 2021-11-03 LAB — ETHANOL: Alcohol, Ethyl (B): 139 mg/dL — ABNORMAL HIGH (ref <10)

## 2021-11-03 MED ORDER — ONDANSETRON HCL 4 MG PO TABS: 4.0000 mg | ORAL_TABLET | Freq: Four times a day (QID) | ORAL | 0 refills | Status: DC

## 2021-11-03 NOTE — ED Notes (Signed)
The pt is intoxicated and he just wants to go to sleep.  His mother is at  the bedside.  I told the mother to keep him awake

## 2021-11-03 NOTE — ED Triage Notes (Signed)
The pt arrived by gems from home.  The pt came home after drinking in a bar.. he reportedly that the pt had 6 shots and a mixed drink.  The parents were worried about him  because he was not responding like normal.  On arrival the pt is not communicating

## 2021-11-03 NOTE — ED Notes (Signed)
PT ambulatory to bathroom

## 2021-11-03 NOTE — ED Provider Notes (Signed)
[facility] EMERGENCY DEPARTMENT Provider Note   CSN: [id number] Arrival date & time: 11/03/21  0109     History Chief Complaint  Patient presents with   Altered Mental Status    Stephen Underwood is a 20 y.o. male.  The history is provided by the patient and medical records.  Altered Mental Status  Level 5 caveat: Intoxication  20 year old male here acutely intoxicated.  Friends took him out for his 20st birthday.  Reported he consumed approximately 6 shots and then stopped responding.  He has not had any vomiting.  No other details known at this time.  Past Medical History:  Diagnosis Date   History of tympanostomy tube placement 2003   Mild to moderate hearing loss     Patient Active Problem List   Diagnosis Date Noted   Moderate malnutrition (HCC) 09/21/2019   Poor appetite 09/21/2019   Weight loss 09/20/2019   Vomiting 09/20/2019   Vision changes 09/20/2019    History reviewed. No pertinent surgical history.     Family History  Problem Relation Age of Onset   Anemia Mother    Depression Mother    Heart murmur Father    ADD / ADHD Father    Epilepsy Father    Peptic Ulcer Father    Bipolar disorder Father    Ovarian cancer Maternal Grandmother    Hypertension Paternal Grandfather    Liver disease Paternal Grandfather    Heart disease Paternal Grandfather     Social History   Tobacco Use   Smoking status: Never   Smokeless tobacco: Never  Vaping Use   Vaping Use: Every day   Start date: 11/18/2018   Substances: Nicotine, CBD   Devices: Vape, e-juice  Substance Use Topics   Alcohol use: Not Currently    Comment: 1-2 times per month   Drug use: Yes    Frequency: 3.0 times per week    Types: Marijuana    Comment: none this am    Home Medications Prior to Admission medications   Medication Sig Start Date End Date Taking? Authorizing Provider  ondansetron (ZOFRAN) 4 MG tablet Take 1 tablet (4 mg total) by mouth every 6 (six)  hours. 11/03/21  Yes Garlon Hatchet, PA-C  ondansetron (ZOFRAN ODT) 4 MG disintegrating tablet Take 1 tablet (4 mg total) by mouth every 8 (eight) hours as needed for nausea or vomiting. 05/26/21   Alvira Monday, MD  pantoprazole (PROTONIX) 20 MG tablet Take 2 tablets (40 mg total) by mouth daily. 07/03/21   Milagros Loll, MD    Allergies    Patient has no known allergies.  Review of Systems   Review of Systems  Unable to perform ROS: Other   Physical Exam Updated Vital Signs BP (!) 104/57    Pulse 67    Resp (!) 6    Ht 5\' 10"  (1.778 m)    Wt 62.6 kg    SpO2 95%    BMI 19.80 kg/m   Physical Exam Vitals and nursing note reviewed.  Constitutional:      Appearance: He is well-developed.     Comments: intoxicated  HENT:     Head: Normocephalic and atraumatic.  Eyes:     Conjunctiva/sclera: Conjunctivae normal.     Pupils: Pupils are equal, round, and reactive to light.  Cardiovascular:     Rate and Rhythm: Normal rate and regular rhythm.     Heart sounds: Normal heart sounds.  Pulmonary:  Effort: Pulmonary effort is normal. No respiratory distress.     Breath sounds: Normal breath sounds. No rhonchi.  Abdominal:     General: Bowel sounds are normal.     Palpations: Abdomen is soft.  Musculoskeletal:        General: Normal range of motion.     Cervical back: Normal range of motion.  Skin:    General: Skin is warm and dry.  Neurological:     Mental Status: He is alert and oriented to person, place, and time.    ED Results / Procedures / Treatments   Labs (all labs ordered are listed, but only abnormal results are displayed) Labs Reviewed  COMPREHENSIVE METABOLIC PANEL - Abnormal; Notable for the following components:      Result Value   Potassium 3.3 (*)    Glucose, Bld 110 (*)    All other components within normal limits  ETHANOL - Abnormal; Notable for the following components:   Alcohol, Ethyl (B) 139 (*)    All other components within normal limits   CBC WITH DIFFERENTIAL/PLATELET  RAPID URINE DRUG SCREEN, HOSP PERFORMED    EKG None  Radiology No results found.  Procedures Procedures   Medications Ordered in ED Medications - No data to display  ED Course  I have reviewed the triage vital signs and the nursing notes.  Pertinent labs & imaging results that were available during my care of the patient were reviewed by me and considered in my medical decision making (see chart for details).    MDM Rules/Calculators/A&P                          20 year old male presenting to the ED acutely intoxicated.  Friends took him out for his 20st birthday this evening, had about 6 shots per EMS.  He is tachycardic on arrival, appears intoxicated.  Will check basic labs.  Labs with ethanol of 139, otherwise reassuring.  6:14 AM Sleeping soundly.  HR now WNL.  Will continue to monitor.  6:14 AM Able to arouse to voice.  States he just feels very tired right now.  VSS.  Mother at bedside, will monitor him.  Appropriate for discharge home.  Advised to rest and hydrate well today, Rx zofran PRN.  Can follow-up with your primary care doctor.  Return here for any new/acute changes.  Final Clinical Impression(s) / ED Diagnoses Final diagnoses:  Alcoholic intoxication without complication (HCC)    Rx / DC Orders ED Discharge Orders          Ordered    ondansetron (ZOFRAN) 4 MG tablet  Every 6 hours        11/03/21 0612             Garlon Hatchet, PA-C 11/03/21 0615    Cheryll Cockayne, MD 11/04/21 2318

## 2021-11-03 NOTE — ED Notes (Signed)
The pt is alert talking and smiling now  his mother remains at  the bedside

## 2021-11-03 NOTE — Discharge Instructions (Signed)
Rest today.  Can use zofran for nausea/vomiting.  Make sure to hydrate. Follow-up with primary care. Return here for any new/acute changes.

## 2022-11-05 ENCOUNTER — Inpatient Hospital Stay (HOSPITAL_COMMUNITY): Payer: Federal, State, Local not specified - PPO

## 2022-11-05 ENCOUNTER — Emergency Department (HOSPITAL_COMMUNITY)
Admission: EM | Admit: 2022-11-05 | Discharge: 2022-11-05 | Discharge status: Against Medical Advice | Payer: Federal, State, Local not specified - PPO | Attending: Emergency Medicine | Admitting: Emergency Medicine

## 2022-11-05 ENCOUNTER — Other Ambulatory Visit: Payer: Self-pay

## 2022-11-05 ENCOUNTER — Emergency Department (HOSPITAL_COMMUNITY): Payer: Federal, State, Local not specified - PPO

## 2022-11-05 ENCOUNTER — Encounter (HOSPITAL_COMMUNITY): Payer: Self-pay

## 2022-11-05 ENCOUNTER — Inpatient Hospital Stay (HOSPITAL_COMMUNITY)
Admission: EM | Admit: 2022-11-05 | Discharge: 2022-11-08 | DRG: 917 | Disposition: A | Discharge status: Other Acute Inpatient Hospital | Payer: Federal, State, Local not specified - PPO | Attending: Internal Medicine | Admitting: Internal Medicine

## 2022-11-05 DIAGNOSIS — J9611 Chronic respiratory failure with hypoxia: Secondary | ICD-10-CM | POA: Diagnosis not present

## 2022-11-05 DIAGNOSIS — J9601 Acute respiratory failure with hypoxia: Secondary | ICD-10-CM | POA: Diagnosis not present

## 2022-11-05 DIAGNOSIS — R569 Unspecified convulsions: Secondary | ICD-10-CM | POA: Diagnosis not present

## 2022-11-05 DIAGNOSIS — R000 Tachycardia, unspecified: Secondary | ICD-10-CM | POA: Diagnosis not present

## 2022-11-05 DIAGNOSIS — Z818 Family history of other mental and behavioral disorders: Secondary | ICD-10-CM | POA: Diagnosis not present

## 2022-11-05 DIAGNOSIS — G928 Other toxic encephalopathy: Secondary | ICD-10-CM | POA: Diagnosis present

## 2022-11-05 DIAGNOSIS — F1310 Sedative, hypnotic or anxiolytic abuse, uncomplicated: Secondary | ICD-10-CM | POA: Diagnosis present

## 2022-11-05 DIAGNOSIS — F322 Major depressive disorder, single episode, severe without psychotic features: Secondary | ICD-10-CM | POA: Diagnosis not present

## 2022-11-05 DIAGNOSIS — F1910 Other psychoactive substance abuse, uncomplicated: Secondary | ICD-10-CM

## 2022-11-05 DIAGNOSIS — Z1152 Encounter for screening for COVID-19: Secondary | ICD-10-CM

## 2022-11-05 DIAGNOSIS — R410 Disorientation, unspecified: Secondary | ICD-10-CM | POA: Diagnosis not present

## 2022-11-05 DIAGNOSIS — T424X2A Poisoning by benzodiazepines, intentional self-harm, initial encounter: Secondary | ICD-10-CM | POA: Diagnosis not present

## 2022-11-05 DIAGNOSIS — F1410 Cocaine abuse, uncomplicated: Secondary | ICD-10-CM | POA: Diagnosis not present

## 2022-11-05 DIAGNOSIS — F419 Anxiety disorder, unspecified: Secondary | ICD-10-CM | POA: Diagnosis not present

## 2022-11-05 DIAGNOSIS — F1210 Cannabis abuse, uncomplicated: Secondary | ICD-10-CM | POA: Diagnosis not present

## 2022-11-05 DIAGNOSIS — F332 Major depressive disorder, recurrent severe without psychotic features: Secondary | ICD-10-CM | POA: Diagnosis not present

## 2022-11-05 DIAGNOSIS — G40901 Epilepsy, unspecified, not intractable, with status epilepticus: Secondary | ICD-10-CM | POA: Diagnosis present

## 2022-11-05 LAB — COMPREHENSIVE METABOLIC PANEL
ALT: 11 U/L (ref 0–44)
AST: 15 U/L (ref 15–41)
Albumin: 3.9 g/dL (ref 3.5–5.0)
Alkaline Phosphatase: 77 U/L (ref 38–126)
Anion gap: 6 (ref 5–15)
BUN: 7 mg/dL (ref 6–20)
CO2: 29 mmol/L (ref 22–32)
Calcium: 9.2 mg/dL (ref 8.9–10.3)
Chloride: 105 mmol/L (ref 98–111)
Creatinine, Ser: 0.91 mg/dL (ref 0.61–1.24)
GFR, Estimated: 57 mL/min — ABNORMAL LOW (ref >60)
Glucose, Bld: 101 mg/dL — ABNORMAL HIGH (ref 70–99)
Potassium: 4.1 mmol/L (ref 3.5–5.1)
Sodium: 140 mmol/L (ref 135–145)
Total Bilirubin: 0.6 mg/dL (ref 0.3–1.2)
Total Protein: 7.7 g/dL (ref 6.5–8.1)

## 2022-11-05 LAB — BLOOD GAS, ARTERIAL
Acid-Base Excess: 3.6 mmol/L — ABNORMAL HIGH (ref 0.0–2.0)
Bicarbonate: 26.5 mmol/L (ref 20.0–28.0)
FIO2: 100 %
MECHVT: 650 mL
O2 Saturation: 99.8 %
Patient temperature: 37
RATE: 16 resp/min
pCO2 arterial: 34 mmHg (ref 32–48)
pH, Arterial: 7.5 — ABNORMAL HIGH (ref 7.35–7.45)
pO2, Arterial: 452 mmHg — ABNORMAL HIGH (ref 83–108)

## 2022-11-05 LAB — URINALYSIS, ROUTINE W REFLEX MICROSCOPIC
Bilirubin Urine: NEGATIVE
Glucose, UA: NEGATIVE mg/dL
Hgb urine dipstick: NEGATIVE
Ketones, ur: NEGATIVE mg/dL
Leukocytes,Ua: NEGATIVE
Nitrite: NEGATIVE
Protein, ur: NEGATIVE mg/dL
Specific Gravity, Urine: 1.012 (ref 1.005–1.030)
pH: 6 (ref 5.0–8.0)

## 2022-11-05 LAB — PROTIME-INR
INR: 1 (ref 0.8–1.2)
Prothrombin Time: 13.2 seconds (ref 11.4–15.2)

## 2022-11-05 LAB — CBC WITH DIFFERENTIAL/PLATELET
Abs Immature Granulocytes: 0.02 10*3/uL (ref 0.00–0.07)
Basophils Absolute: 0 10*3/uL (ref 0.0–0.1)
Basophils Relative: 0 %
Eosinophils Absolute: 0.1 10*3/uL (ref 0.0–0.5)
Eosinophils Relative: 1 %
HCT: 45.9 % (ref 39.0–52.0)
Hemoglobin: 15.2 g/dL (ref 13.0–17.0)
Immature Granulocytes: 0 %
Lymphocytes Relative: 32 %
Lymphs Abs: 3 10*3/uL (ref 0.7–4.0)
MCH: 29 pg (ref 26.0–34.0)
MCHC: 33.1 g/dL (ref 30.0–36.0)
MCV: 87.6 fL (ref 80.0–100.0)
Monocytes Absolute: 0.7 10*3/uL (ref 0.1–1.0)
Monocytes Relative: 8 %
Neutro Abs: 5.4 10*3/uL (ref 1.7–7.7)
Neutrophils Relative %: 59 %
Platelets: 268 10*3/uL (ref 150–400)
RBC: 5.24 MIL/uL (ref 4.22–5.81)
RDW: 12.9 % (ref 11.5–15.5)
WBC: 9.2 10*3/uL (ref 4.0–10.5)
nRBC: 0 % (ref 0.0–0.2)

## 2022-11-05 LAB — POCT I-STAT 7, (LYTES, BLD GAS, ICA,H+H)
Acid-Base Excess: 0 mmol/L (ref 0.0–2.0)
Bicarbonate: 24.1 mmol/L (ref 20.0–28.0)
Calcium, Ion: 1.24 mmol/L (ref 1.15–1.40)
HCT: 42 % (ref 39.0–52.0)
Hemoglobin: 14.3 g/dL (ref 13.0–17.0)
O2 Saturation: 99 %
Patient temperature: 99.6
Potassium: 3.3 mmol/L — ABNORMAL LOW (ref 3.5–5.1)
Sodium: 142 mmol/L (ref 135–145)
TCO2: 25 mmol/L (ref 22–32)
pCO2 arterial: 37.5 mmHg (ref 32–48)
pH, Arterial: 7.419 (ref 7.35–7.45)
pO2, Arterial: 150 mmHg — ABNORMAL HIGH (ref 83–108)

## 2022-11-05 LAB — APTT: aPTT: 33 seconds (ref 24–36)

## 2022-11-05 LAB — MRSA NEXT GEN BY PCR, NASAL: MRSA by PCR Next Gen: NOT DETECTED

## 2022-11-05 LAB — RAPID URINE DRUG SCREEN, HOSP PERFORMED
Amphetamines: NOT DETECTED
Barbiturates: NOT DETECTED
Benzodiazepines: POSITIVE — AB
Cocaine: POSITIVE — AB
Opiates: NOT DETECTED
Tetrahydrocannabinol: POSITIVE — AB

## 2022-11-05 LAB — ETHANOL: Alcohol, Ethyl (B): <10 mg/dL (ref <10)

## 2022-11-05 LAB — RESP PANEL BY RT-PCR (RSV, FLU A&B, COVID)  RVPGX2
Influenza A by PCR: NEGATIVE
Influenza B by PCR: NEGATIVE
Resp Syncytial Virus by PCR: NEGATIVE
SARS Coronavirus 2 by RT PCR: NEGATIVE

## 2022-11-05 LAB — AMMONIA: Ammonia: 16 umol/L (ref 9–35)

## 2022-11-05 LAB — PHOSPHORUS: Phosphorus: 3 mg/dL (ref 2.5–4.6)

## 2022-11-05 LAB — MAGNESIUM: Magnesium: 2.4 mg/dL (ref 1.7–2.4)

## 2022-11-05 MED ORDER — LORAZEPAM 2 MG/ML IJ SOLN
INTRAMUSCULAR | Status: AC
  Administered 2022-11-05: 2 mg via INTRAVENOUS
  Filled 2022-11-05: qty 2

## 2022-11-05 MED ORDER — ORAL CARE MOUTH RINSE
15.0000 mL | OROMUCOSAL | Status: DC
  Administered 2022-11-05 – 2022-11-06 (×10): 15 mL via OROMUCOSAL

## 2022-11-05 MED ORDER — MIDAZOLAM BOLUS VIA INFUSION: 0.0000 mg | INTRAVENOUS | Status: DC | PRN

## 2022-11-05 MED ORDER — DOCUSATE SODIUM 100 MG PO CAPS: 100.0000 mg | ORAL_CAPSULE | Freq: Two times a day (BID) | ORAL | Status: DC | PRN

## 2022-11-05 MED ORDER — LEVETIRACETAM IN NACL 1000 MG/100ML IV SOLN: 1000.0000 mg | INTRAVENOUS | Status: DC

## 2022-11-05 MED ORDER — SODIUM CHLORIDE 0.9 % IV SOLN: 60.0000 mg/kg | Freq: Once | INTRAVENOUS | Status: DC

## 2022-11-05 MED ORDER — ROCURONIUM BROMIDE 10 MG/ML (PF) SYRINGE
PREFILLED_SYRINGE | INTRAVENOUS | Status: AC
  Administered 2022-11-05: 10 mL
  Filled 2022-11-05: qty 10

## 2022-11-05 MED ORDER — ETOMIDATE 2 MG/ML IV SOLN
INTRAVENOUS | Status: AC
  Administered 2022-11-05: 20 mg
  Filled 2022-11-05: qty 10

## 2022-11-05 MED ORDER — LORAZEPAM 2 MG/ML IJ SOLN
2.0000 mg | Freq: Once | INTRAMUSCULAR | Status: AC
  Administered 2022-11-05: 2 mg via INTRAVENOUS

## 2022-11-05 MED ORDER — FENTANYL CITRATE PF 50 MCG/ML IJ SOSY: 50.0000 ug | PREFILLED_SYRINGE | INTRAMUSCULAR | Status: DC | PRN

## 2022-11-05 MED ORDER — FENTANYL CITRATE PF 50 MCG/ML IJ SOSY
50.0000 ug | PREFILLED_SYRINGE | INTRAMUSCULAR | Status: DC | PRN
  Administered 2022-11-05 (×2): 100 ug via INTRAVENOUS
  Filled 2022-11-05 (×2): qty 2

## 2022-11-05 MED ORDER — DOCUSATE SODIUM 50 MG/5ML PO LIQD
100.0000 mg | Freq: Two times a day (BID) | ORAL | Status: DC
  Administered 2022-11-05 – 2022-11-06 (×2): 100 mg
  Filled 2022-11-05 (×4): qty 10

## 2022-11-05 MED ORDER — LACTATED RINGERS IV SOLN: INTRAVENOUS | Status: DC

## 2022-11-05 MED ORDER — LORAZEPAM 2 MG/ML IJ SOLN
4.0000 mg | Freq: Once | INTRAMUSCULAR | Status: AC
  Administered 2022-11-05: 4 mg via INTRAVENOUS

## 2022-11-05 MED ORDER — PANTOPRAZOLE SODIUM 40 MG IV SOLR
40.0000 mg | Freq: Every day | INTRAVENOUS | Status: DC
  Administered 2022-11-05 – 2022-11-06 (×2): 40 mg via INTRAVENOUS
  Filled 2022-11-05 (×2): qty 10

## 2022-11-05 MED ORDER — PROPOFOL 1000 MG/100ML IV EMUL
0.0000 ug/kg/min | INTRAVENOUS | Status: DC
  Administered 2022-11-05 – 2022-11-06 (×4): 50 ug/kg/min via INTRAVENOUS
  Filled 2022-11-05 (×4): qty 100

## 2022-11-05 MED ORDER — LORAZEPAM 2 MG/ML IJ SOLN: 2.0000 mg | Freq: Once | INTRAMUSCULAR | Status: AC

## 2022-11-05 MED ORDER — POLYETHYLENE GLYCOL 3350 17 G PO PACK
17.0000 g | PACK | Freq: Every day | ORAL | Status: DC
  Administered 2022-11-06: 17 g
  Filled 2022-11-05: qty 1

## 2022-11-05 MED ORDER — LEVETIRACETAM IN NACL 1500 MG/100ML IV SOLN
1500.0000 mg | INTRAVENOUS | Status: AC
  Administered 2022-11-05: 1500 mg via INTRAVENOUS
  Filled 2022-11-05: qty 100

## 2022-11-05 MED ORDER — PROPOFOL 1000 MG/100ML IV EMUL
0.0000 ug/kg/min | INTRAVENOUS | Status: DC
  Administered 2022-11-05: 40 ug/kg/min via INTRAVENOUS
  Filled 2022-11-05 (×2): qty 100

## 2022-11-05 MED ORDER — FENTANYL CITRATE PF 50 MCG/ML IJ SOSY
100.0000 ug | PREFILLED_SYRINGE | Freq: Once | INTRAMUSCULAR | Status: AC
  Administered 2022-11-05: 100 ug via INTRAVENOUS
  Filled 2022-11-05: qty 2

## 2022-11-05 MED ORDER — MIDAZOLAM HCL 2 MG/2ML IJ SOLN: 1.0000 mg | INTRAMUSCULAR | Status: DC | PRN

## 2022-11-05 MED ORDER — HEPARIN SODIUM (PORCINE) 5000 UNIT/ML IJ SOLN
5000.0000 [IU] | Freq: Three times a day (TID) | INTRAMUSCULAR | Status: DC
  Administered 2022-11-05 – 2022-11-08 (×8): 5000 [IU] via SUBCUTANEOUS
  Filled 2022-11-05 (×10): qty 1

## 2022-11-05 MED ORDER — MIDAZOLAM-SODIUM CHLORIDE 100-0.9 MG/100ML-% IV SOLN
0.0000 mg/h | INTRAVENOUS | Status: DC
  Administered 2022-11-05: 2 mg/h via INTRAVENOUS
  Administered 2022-11-06: 8 mg/h via INTRAVENOUS
  Filled 2022-11-05 (×2): qty 100

## 2022-11-05 MED ORDER — ORAL CARE MOUTH RINSE: 15.0000 mL | OROMUCOSAL | Status: DC | PRN

## 2022-11-05 MED ORDER — POLYETHYLENE GLYCOL 3350 17 G PO PACK: 17.0000 g | PACK | Freq: Every day | ORAL | Status: DC | PRN

## 2022-11-05 MED ORDER — MIDAZOLAM HCL 2 MG/2ML IJ SOLN
1.0000 mg | INTRAMUSCULAR | Status: DC | PRN
  Administered 2022-11-05 (×3): 2 mg via INTRAVENOUS

## 2022-11-05 NOTE — ED Triage Notes (Signed)
Pt was dropped off by friends, they state he has been like this all night. Pt actively seizing upon arrival to ED.

## 2022-11-05 NOTE — ED Provider Notes (Addendum)
[facility]   CSN: [id number] Arrival date & time: 11/05/22  1204     History  Chief Complaint  Patient presents with   Seizures    Stephen Underwood is a 21 y.o. male.   Seizures    21 year old male initially presenting to the emergency department as a Stephen Underwood who was dropped off by friends in the ED parking lot actively seizing.  Per his companion's, "he did a couple of perks last night and had at least 6 seizures last night."  On arrival, the patient was unresponsive, GCS 5, actively seizing.  He was emergently bedded and intubated for airway protection in the setting of status epilepticus and poor GCS.  Home Medications Prior to Admission medications   Not on File      Allergies    Patient has no allergy information on record.    Review of Systems   Review of Systems  Neurological:  Positive for seizures.  All other systems reviewed and are negative.   Physical Exam Updated Vital Signs BP (!) 148/127   Pulse 85   Resp 19   Ht 6\' 2"  (1.88 m)   Wt 61.2 kg   SpO2 100%   BMI 17.33 kg/m  Physical Exam Vitals and nursing note reviewed.  Constitutional:      General: He is in acute distress.     Appearance: He is ill-appearing and diaphoretic.     Comments: Unresponsive, GCS 5  HENT:     Head: Normocephalic and atraumatic.  Eyes:     Conjunctiva/sclera: Conjunctivae normal.     Comments: Pupils dilated bilaterally, 4 to 5 mm, reactive to light  Cardiovascular:     Rate and Rhythm: Regular rhythm. Tachycardia present.  Pulmonary:     Effort: Pulmonary effort is normal. No respiratory distress.  Abdominal:     General: There is no distension.     Tenderness: There is no guarding.  Musculoskeletal:        General: No deformity or signs of injury.     Cervical back: Neck supple.  Skin:    Findings: No lesion or rash.  Neurological:     Comments: Unable to fully assess, actively seizing     ED Results  / Procedures / Treatments   Labs (all labs ordered are listed, but only abnormal results are displayed) Labs Reviewed  COMPREHENSIVE METABOLIC PANEL - Abnormal; Notable for the following components:      Result Value   Glucose, Bld 101 (*)    GFR, Estimated 57 (*)    All other components within normal limits  RAPID URINE DRUG SCREEN, HOSP PERFORMED - Abnormal; Notable for the following components:   Cocaine POSITIVE (*)    Benzodiazepines POSITIVE (*)    Tetrahydrocannabinol POSITIVE (*)    All other components within normal limits  RESP PANEL BY RT-PCR (RSV, FLU A&B, COVID)  RVPGX2  CBC WITH DIFFERENTIAL/PLATELET  MAGNESIUM  PHOSPHORUS  PROTIME-INR  URINALYSIS, ROUTINE W REFLEX MICROSCOPIC  APTT  ETHANOL  AMMONIA  LACTIC ACID, PLASMA  BLOOD GAS, ARTERIAL  CBG MONITORING, ED  I-STAT CHEM 8, ED    EKG EKG Interpretation  Date/Time:  Tuesday November 05 2022 13:01:31 EST Ventricular Rate:  82 PR Interval:  136 QRS Duration: 106 QT Interval:  358 QTC Calculation: 419 R Axis:   93 Text Interpretation: Sinus rhythm Consider right ventricular hypertrophy Benign early repolarization Confirmed by [id number] ([id number]) on 11/05/2022 1:42:07 PM  Radiology CT Head Wo Contrast  Result Date: 11/05/2022 CLINICAL DATA:  New onset seizure, no history of trauma EXAM: CT HEAD WITHOUT CONTRAST TECHNIQUE: Contiguous axial images were obtained from the base of the skull through the vertex without intravenous contrast. RADIATION DOSE REDUCTION: This exam was performed according to the departmental dose-optimization program which includes automated exposure control, adjustment of the mA and/or kV according to patient size and/or use of iterative reconstruction technique. COMPARISON:  None Available. FINDINGS: Brain: Normal ventricular morphology. No midline shift or mass effect. Normal appearance of brain parenchyma. No intracranial hemorrhage, mass lesion, evidence of acute infarction, or  extra-axial fluid collection. Vascular: No hyperdense vessels Skull: Intact Sinuses/Orbits: Clear. Material question cerumen in external auditory canals bilaterally. Other: N/A IMPRESSION: No acute intracranial abnormalities. Electronically Signed   By: Ulyses Southward M.D.   On: 11/05/2022 12:47   DG Chest Port 1 View  Result Date: 11/05/2022 CLINICAL DATA:  Confusion.  Endotracheal intubation. EXAM: PORTABLE CHEST 1 VIEW COMPARISON:  None Available. FINDINGS: Endotracheal tube tip 4 cm above the carina. Heart size is normal. Mediastinal shadows are normal. The lungs are clear. No pneumothorax or hemothorax. No abnormal bone finding. IMPRESSION: Endotracheal tube tip 4 cm above the carina. No active cardiopulmonary disease. Electronically Signed   By: Paulina Fusi M.D.   On: 11/05/2022 12:34    Procedures .Critical Care  Performed by: Ernie Avena, MD Authorized by: Ernie Avena, MD   Critical care provider statement:    Critical care time (minutes):  32   Critical care was necessary to treat or prevent imminent or life-threatening deterioration of the following conditions:  Respiratory failure and CNS failure or compromise   Critical care was time spent personally by me on the following activities:  Development of treatment plan with patient or surrogate, discussions with consultants, evaluation of patient's response to treatment, examination of patient, ordering and review of laboratory studies, ordering and review of radiographic studies, ordering and performing treatments and interventions, pulse oximetry, re-evaluation of patient's condition and review of old charts   Care discussed with: admitting provider   Procedure Name: Intubation Date/Time: 11/05/2022 12:16 PM  Performed by: Ernie Avena, MDPre-anesthesia Checklist: Patient identified, Patient being monitored, Emergency Drugs available, Timeout performed and Suction available Oxygen Delivery Method: Ambu bag Preoxygenation:  Pre-oxygenation with 100% oxygen Induction Type: Rapid sequence Ventilation: Mask ventilation without difficulty Laryngoscope Size: Glidescope and 3 Grade View: Grade I Tube size: 7.5 mm Number of attempts: 1 Placement Confirmation: ETT inserted through vocal cords under direct vision, CO2 detector and Breath sounds checked- equal and bilateral Secured at: 24 cm        Medications Ordered in ED Medications  LORazepam (ATIVAN) injection 2 mg (has no administration in time range)  midazolam (VERSED) 100 mg/100 mL (1 mg/mL) premix infusion (2 mg/hr Intravenous New Bag/Given 11/05/22 1337)  heparin injection 5,000 Units (has no administration in time range)  pantoprazole (PROTONIX) injection 40 mg (has no administration in time range)  lactated ringers infusion (has no administration in time range)  docusate sodium (COLACE) capsule 100 mg (has no administration in time range)  polyethylene glycol (MIRALAX / GLYCOLAX) packet 17 g (has no administration in time range)  docusate (COLACE) 50 MG/5ML liquid 100 mg (has no administration in time range)  polyethylene glycol (MIRALAX / GLYCOLAX) packet 17 g (has no administration in time range)  propofol (DIPRIVAN) 1000 MG/100ML infusion (50 mcg/kg/min  61.2 kg Intravenous New Bag/Given 11/05/22 1332)  fentaNYL (SUBLIMAZE) injection 50  mcg (has no administration in time range)  fentaNYL (SUBLIMAZE) injection 50-200 mcg (has no administration in time range)  midazolam (VERSED) injection 1-2 mg (has no administration in time range)  LORazepam (ATIVAN) injection 2 mg (2 mg Intravenous Given 11/05/22 1213)  LORazepam (ATIVAN) injection 4 mg (4 mg Intravenous Given 11/05/22 1213)  etomidate (AMIDATE) 2 MG/ML injection (20 mg  Given 11/05/22 1215)  rocuronium bromide 100 MG/10ML SOSY (10 mLs  Given 11/05/22 1215)  levETIRAcetam (KEPPRA) IVPB 1500 mg/ 100 mL premix (0 mg Intravenous Stopped 11/05/22 1315)  fentaNYL (SUBLIMAZE) injection 100 mcg (100  mcg Intravenous Given 11/05/22 1330)    ED Course/ Medical Decision Making/ A&P                           Medical Decision Making Amount and/or Complexity of Data Reviewed Labs: ordered. Radiology: ordered.  Risk Prescription drug management. Decision regarding hospitalization.     21 year old male initially presenting to the emergency department as a Stephen Underwood who was dropped off by friends in the ED parking lot actively seizing.  Per his companion's, "he did a couple of perks last night and had at least 6 seizures last night."  On arrival, the patient was unresponsive, GCS 5, actively seizing.  He was emergently bedded and intubated for airway protection in the setting of status epilepticus and poor GCS.  On arrival, the patient was tachycardic, seizing and unresponsive. CBG was 185 on arrival. He was administered 8mg  of Ativan and continued to seize. Concern for status epilepticus with at least six seizures overnight in the setting of drug use. Ddx: Status epilepticus, Meningitis, Encephalitis, ICH, electrolyte abnormality, drug overdose and hypoxic ischemic insult.  The patient was bagged and made ready for intubation and was subsequently intubated with a 7 5 ET tube as per the procedure note above, 24 at the teeth, postintubation chest x-ray revealed no focal cardiac abnormality or pulmonary abnormality, ET tube 4 cm above the carina.  CT head was performed and revealed no evidence of intracranial hemorrhage or other acute abnormality.  Consulted neurology for status epilepticus and ordered an EEG.  I consulted pulmonary critical care for ICU admission.  Spoke with Dr. of neurology who will come see the patient in person.  With Dr. Derry Lory who will have a team member from pulmonary critical care, evaluate the patient.  Neurology recommended [facility] admission. Rectal temp obtained at 98, lower concern for meningitis at this time.  UDS collected prior to benzodiazepine  administration positive for cocaine, benzodiazepines, THC.  Magnesium resulted normal, CMP was unremarkable, a urinalysis was unremarkable and a CBC was without a leukocytosis or anemia.  PCCM evaluated the patient who was subsequently admitted to the [facility] ICU in critical condition.   Final Clinical Impression(s) / ED Diagnoses Final diagnoses:  Status epilepticus [facility])  Drug abuse [facility])    Rx / DC Orders ED Discharge Orders     None         [facility], MD 11/05/22 1343    11/07/22, MD 11/05/22 1344

## 2022-11-05 NOTE — Progress Notes (Signed)
Initial EEG with diffuse beta activity, no ongoing seizure.   Ritta Slot, MD [facility] [phone]  If 7pm- 7am, please page neurology on call as listed in AMION.

## 2022-11-05 NOTE — Progress Notes (Signed)
RT placed pt on ventilator at this time. Settings per [facility].

## 2022-11-05 NOTE — H&P (Signed)
NAME:  Stephen Underwood, MRN:  [id number], DOB:  11/18/1876, LOS: 0 ADMISSION DATE:  11/05/2022, CONSULTATION DATE:  11/05/22 REFERRING MD:  Karene Fry CHIEF COMPLAINT:  Seizures   History of Present Illness:  Stephen Underwood is a 21 y.o. male who has a PMH of anxiety. His chart is currently marked as [name]; however, it has not yet been merged with his true chart Stephen Underwood DOB 2001-04-04). He was dropped off by friends in car outside of [facility] ED 12/19 with active seizures. Friends informed ED staff that he apparently "did a couple of perks last night and had at least 6 seizures".  In ED, he continued to seize and was therefore intubated for airway protection. Head CT was negative. UDS before benzo administration was positive for cocaine, benzos, THC. Labs were normal.  Neurology was consulted and pt was loaded with 1.5g Keppra. EEG has been ordered. They are recommending admission to [facility] for EEG and ongoing seizure workup and management.  PCCM called for admission.  Pertinent  Medical History:  has Status epilepticus (HCC) on their problem list.  Significant Hospital Events: Including procedures, antibiotic start and stop dates in addition to other pertinent events   12/19 admit.  Interim History / Subjective:  Sedated on propofol.  Objective:  Blood pressure (!) 148/127, pulse 85, resp. rate 19, height 6\' 2"  (1.88 m), weight 61.2 kg, SpO2 100 %.    Vent Mode: PRVC FiO2 (%):  [100 %] 100 % Set Rate:  [16 bmp] 16 bmp Vt Set:  [650 mL] 650 mL PEEP:  [5 cmH20] 5 cmH20 Plateau Pressure:  [12 cmH20] 12 cmH20   Intake/Output Summary (Last 24 hours) at 11/05/2022 1326 Last data filed at 11/05/2022 1315 Gross per 24 hour  Intake 100 ml  Output --  Net 100 ml   Filed Weights   11/05/22 1219  Weight: 61.2 kg    Examination: General: Young adult male, resting in bed, in NAD. Neuro: Sedated, not responsive. HEENT: [place]/AT. Sclerae anicteric. Pupils dilated bilaterally but equal  and responsive to light. ETT in place. No meningismus. Cardiovascular: RRR, no M/R/G.  Lungs: Respirations even and unlabored.  CTA bilaterally, No W/R/R. Abdomen: BS x 4, soft, NT/ND.  Musculoskeletal: No gross deformities, no edema.  Skin: Multiple tattoos. Intact, warm, no rashes.  Labs/imaging personally reviewed:  CT head 12/19 > neg. EEG 12/19 >   Assessment & Plan:   Status Epilepticus - no known hx but presume drug induced given that UDS was positive for cocaine, benzos, THC (collected before any benzo administration in ED). Ethanol negative. - Neuro consulted, appreciate the assistance. - Transferring to [facility] neuro ICU for admission. - EEG per neuro. - AED's per neuro (loaded with 1.5g Keppra in ED).  Acute hypoxic respiratory failure with inability to protect the airway - 2/2 above. S/p intubation in ED. - Full vent support. - Bronchial hygiene. - SBT when mental status allows. - CXR intermittently.    Best practice (evaluated daily):  Diet/type: NPO DVT prophylaxis: prophylactic heparin  GI prophylaxis: PPI Lines: N/A Foley:  N/A Code Status:  full code Last date of multidisciplinary goals of care discussion: None yet.  Labs   CBC: Recent Labs  Lab 11/05/22 1205  WBC 9.2  NEUTROABS 5.4  HGB 15.2  HCT 45.9  MCV 87.6  PLT 268    Basic Metabolic Panel: Recent Labs  Lab 11/05/22 1205  NA 140  K 4.1  CL 105  CO2 29  GLUCOSE  101*  BUN 7*  CREATININE 0.91  CALCIUM 9.2  MG 2.4  PHOS 3.0   GFR: Estimated Creatinine Clearance: -5.6 mL/min (by C-G formula based on SCr of 0.91 mg/dL). Recent Labs  Lab 11/05/22 1205  WBC 9.2    Liver Function Tests: Recent Labs  Lab 11/05/22 1205  AST 15  Underwood 11  ALKPHOS 77  BILITOT 0.6  PROT 7.7  ALBUMIN 3.9   No results for input(s): "LIPASE", "AMYLASE" in the last 168 hours. Recent Labs  Lab 11/05/22 1248  AMMONIA 16    ABG No results found for: "PHART", "PCO2ART", "PO2ART", "HCO3", "TCO2",  "ACIDBASEDEF", "O2SAT"   Coagulation Profile: Recent Labs  Lab 11/05/22 1205  INR 1.0    Cardiac Enzymes: No results for input(s): "CKTOTAL", "CKMB", "CKMBINDEX", "TROPONINI" in the last 168 hours.  HbA1C: No results found for: "HGBA1C"  CBG: No results for input(s): "GLUCAP" in the last 168 hours.  Review of Systems:   Unable to obtain as pt is encephalopathic.  Past Medical History:  He,  has no past medical history on file.   Surgical History:  Not available.   Social History:      Family History:  His family history is not on file.   Allergies Not on File   Home Medications  Prior to Admission medications   Not on File     Critical care time: 30 min.   Rutherford Guys, PA - [facility] For pager details, please see AMION or use Epic chat  After 1900, please call [facility] for cross coverage needs 11/05/2022, 1:26 PM

## 2022-11-05 NOTE — Progress Notes (Signed)
Pt belongings on arrival to 4N19 include: Black pants, socks, underwear and belt.

## 2022-11-05 NOTE — ED Notes (Signed)
[facility] at bedside 

## 2022-11-05 NOTE — Consult Note (Addendum)
NEUROLOGY CONSULTATION NOTE   Date of service: November 05, 2022 Patient Name: Stephen Underwood MRN:  [id number] DOB:  2000/12/02 Reason for consult: "Multiple seizures in the setting of substance use and poor GCS and intubated" Requesting Provider: Karren Burly, MD _ _ _   _ __   _ __ _ _  __ __   _ __   __ _  History of Present Illness  Stephen Underwood is a 21 y.o. male with unknown medical history who was brought in to the ED by his friends with a GCS of 5. Friends reported 6 seizures lastnight and patient noted to smell of weed. He was intubated in the ED for failure to protect his airway. In the ED, he was noted to be actively seizing.  He was given Ativan x 2, Keppra 60mg /Kg and started on propofol. UDS positive for cocaine, Benzo and THC. EtOH negative. CTH with no acute intracranial abnormalities.  On my evaluation, no obvious clinical seizure activity noted. He is being transferred to [facility] for a cEEG to rule out subclinical status. No relatives/family at bedside.    ROS   Unable to obtain due to review of systems secondary to intubation and encephalopathy.  Past History  No past medical history on file.  No family history on file. Social History   Socioeconomic History   Marital status: Single    Spouse name: Not on file   Number of children: Not on file   Years of education: Not on file   Highest education level: Not on file  Occupational History   Not on file  Tobacco Use   Smoking status: Not on file   Smokeless tobacco: Not on file  Substance and Sexual Activity   Alcohol use: Not on file   Drug use: Not on file   Sexual activity: Not on file  Other Topics Concern   Not on file  Social History Narrative   Not on file   Social Determinants of Health   Financial Resource Strain: Not on file  Food Insecurity: Not on file  Transportation Needs: Not on file  Physical Activity: Not on file  Stress: Not on file  Social Connections: Not on file   Not  on File  Medications  (Not in a hospital admission)    Vitals   Vitals:   11/05/22 1220 11/05/22 1230 11/05/22 1255 11/05/22 1310  BP:    (!) 148/127  Pulse:  (!) 115  85  Resp:  16  19  Temp:      TempSrc:      SpO2:  100% 100% 100%  Weight:      Height: 6\' 2"  (1.88 m)        Body mass index is 17.33 kg/m.  Physical Exam   General: Laying comfortably in bed; in no acute distress.  HENT: Normal oropharynx and mucosa. Normal external appearance of ears and nose.  Neck: Supple, no pain or tenderness  CV: No JVD. No peripheral edema.  Pulmonary: Symmetric Chest rise.  Intubated and breathing over vent. Abdomen: Soft to touch, non-tender.  Ext: No cyanosis, edema, or deformity  Skin: No rash. Normal palpation of skin.   Musculoskeletal: Normal digits and nails by inspection. No clubbing.   Neurologic Examination on propofol.  Mental status/Cognition: obtunded mentation, Grimaces to tactile stimulation into nares stimulation.  Does not open eyes to voice, does not follow commands, no obvious attempts to communicate. Speech/language: Intubated, obtunded mentation which significantly limits speech  evaluation.  Does not follow commands.  cranial nerves:   CN II Pupils equal and round.  Sluggish response to bright light.  Does not blink to threat.   CN III,IV,VI Doll's eye reflex is absent   CN V Corneals absent BL   CN VII Symmetric facial grimace.   CN VIII Does not turn head towards speech.   CN IX & X Cough intact.  Unable to elicit a gag.   CN XI Head is midline.   CN XII Tongue roughly appears midline.  Does not protrude tongue on command   Motor/sensory:  Muscle bulk: normal, tone normal Unable to do detailed motor strength testing secondary to obtunded mentation. With nares stimulation, he does seem to lift up bilateral upper extremity to try to bring them closer to his nose. To proximal pinch in bilateral lower extremities, he will lift his leg up.  Reflexes:   Right Left Comments  Pectoralis      Biceps (C5/6) 2 2   Brachioradialis (C5/6) 2 2    Triceps (C6/7) 2 2    Patellar (L3/4) 2 2    Achilles (S1)      Hoffman      Plantar Mute Mute   Jaw jerk    Coordination/Complex Motor:  Unable to assess.  Labs   CBC:  Recent Labs  Lab 11/05/22 1205  WBC 9.2  NEUTROABS 5.4  HGB 15.2  HCT 45.9  MCV 87.6  PLT 268    Basic Metabolic Panel:  Lab Results  Component Value Date   NA 140 11/05/2022   K 4.1 11/05/2022   CO2 29 11/05/2022   GLUCOSE 101 (H) 11/05/2022   BUN 7 11/05/2022   CREATININE 0.91 11/05/2022   CALCIUM 9.2 11/05/2022   GFRNONAA 57 (L) 11/05/2022   Lipid Panel: No results found for: "LDLCALC" HgbA1c: No results found for: "HGBA1C" Urine Drug Screen:     Component Value Date/Time   LABOPIA NONE DETECTED 11/05/2022 1210   COCAINSCRNUR POSITIVE (A) 11/05/2022 1210   LABBENZ POSITIVE (A) 11/05/2022 1210   AMPHETMU NONE DETECTED 11/05/2022 1210   THCU POSITIVE (A) 11/05/2022 1210   LABBARB NONE DETECTED 11/05/2022 1210    Alcohol Level     Component Value Date/Time   ETH <10 11/05/2022 1205    CT Head without contrast(Personally reviewed): CTH was negative for a large hypodensity concerning for a large territory infarct or hyperdensity concerning for an ICH  MRI Brain: pending  cEEG:  pending  Impression   Stephen Underwood is a 21 y.o. male with unknown medical history who was brought in to the ED by his friends with a GCS of 5. Friends reported 6 seizures lastnight and patient noted to smell of weed. He was intubated in the ED for failure to protect his airway. In the ED, he was noted to be actively seizing.  He was given Ativan x 2, Keppra 60mg /Kg and started on propofol. UDS positive for cocaine, Benzo and THC. EtOH negative. CTH with no acute intracranial abnormalities. Vitals and labs not concerning for meningitis and no obvious nuchal rigidity noted on exam. Labs with no significant electrolyte  abnormalities.  Presentation is concerning for seizure clustering versus status epilepticus in the setting of polysubstance abuse. On my evaluation, no clear clinical signs of seizures.  However, he will need to be hooked up to continuous EEG to definitively rule out subclinical seizures given his obtunded mentation.  He will need to be [facility]  for the continuous EEG.  Recommendations  - transfer to [facility] - please page neurology for an order for cEEG when patient arrives. - cEEG - Ativan 2mg  PRN for any seizure activity. - MRI Brain w/o contrast. - seizure precautions. ______________________________________________________________________  This patient is critically ill and at significant risk of neurological worsening, death and care requires constant monitoring of vital signs, hemodynamics,respiratory and cardiac monitoring, neurological assessment, discussion with family, other specialists and medical decision making of high complexity. I spent 40 minutes of neurocritical care time  in the care of  this patient. This was time spent independent of any time provided by nurse practitioner or PA.  [facility] Pager Number Erick [phone] 11/05/2022  2:44 PM  Thank you for the opportunity to take part in the care of this patient. If you have any further questions, please contact the neurology consultation attending.  Signed,  11/07/2022 [facility] Pager Number Erick Blinks _ _ _   _ __   _ __ _ _  __ __   _ __   __ _

## 2022-11-05 NOTE — Procedures (Incomplete)
Patient Name: Stephen Underwood  MRN: [id number]  Epilepsy Attending: Charlsie Quest  Referring Physician/Provider: Ernie Avena, MD  Date: 11/05/2022  Duration:   Patient history: 21 year old male initially presenting to the emergency department as a [name] who was dropped off by friends in the ED parking lot actively seizing. Per his companion's, "he did a couple of perks last night and had at least 6 seizures last night." EEG to evaluate for seizure.  Level of alertness: Awake, drowsy, sleep, comatose, lethargic ***  AEDs during EEG study: LEV  Technical aspects: This EEG study was done with scalp electrodes positioned according to the 10-20 International system of electrode placement. Electrical activity was reviewed with band pass filter of 1-70Hz , sensitivity of 7 uV/mm, display speed of 38mm/sec with a 60Hz  notched filter applied as appropriate. EEG data were recorded continuously and digitally stored.  Video monitoring was available and reviewed as appropriate.  Description: The posterior dominant rhythm consists of 9-10 Hz activity of moderate voltage (25-35 uV) seen predominantly in posterior head regions, symmetric and reactive to eye opening and eye closing. Drowsiness was characterized by attenuation of the posterior background rhythm. Sleep was characterized by vertex waves, sleep spindles (12 to 14 Hz), maximal frontocentral region.  There is an excessive amount of 15 to 18 Hz, 2-3 uV beta activity with irregular morphology distributed symmetrically and diffusely.   EEG showed continuous/intermittent generalized polymorphic sharply contoured 3 to 6 Hz theta-delta slowing.  EEG showed generalized periodic discharges with triphasic morphology at  Hz, more prominent when awake/stimulated.  Generalized Spike/Polyspikes/Sharp waves were noted in left/right frontal/temporal/parietal/occipital region.   Seizure was noted arising from left/right frontal/temporal/parietal/occipital  region.  During seizure, patient was noted to.  Onset of seizure, total duration  Event button was pressed on at for .  Concomitant EEG before, during and after the event showed normal posterior dominant rhythm, did not show any EEG changes suggest seizure.  Patient was noted to have episodes of brief sudden eye opening with whole body jerking every few seconds.  Concomitant EEG showed generalized polyspikes consistent with myoclonic seizures.  In between seizures EEG showed generalized background suppression.   Hyperventilation did not show any EEG change.  Physiologic photic driving was not seen during photic stimulation.  Hyperventilation and photic stimulation were not performed.     ABNORMALITY -Sharp wave, generalized, left/right frontal/temporal/parietal/occipital region.  -Spike,generalized, left/right frontal/temporal/parietal/occipital region.  -Polyspikes, generalized, left/right frontal/temporal/parietal/occipital region.  - Lateralized periodic discharges with overriding fast activity ( LPD +) left/right, maximal frontal/temporal/parietal/occipital region - Periodic discharges with triphasic morphology, generalized ( GPDs) - Intermittent slow, generalized - Continuous slow, generalized - Excessive beta, generalized    IMPRESSION: This study is within normal limits.  This study is suggestive of mild/moderate/severe diffuse encephalopathy, nonspecific etiology but likely related to sedation, toxic-metabolic etiology, anoxic/hypoxic brain injury This study is suggestive of cortical dysfunction arising from left/right frontal/temporal/parietal/occipital region, nonspecific etiology, likely secondary to underlying structural abnormality This study showed evidence of potential epileptogenicity arising from This study showed seizures arising from No seizures or epileptiform discharges were seen throughout the recording. However, only wakefulness and drowsiness were recorded. If  suspicion for interictal activity remains a concern, a prolonged study including sleep should be considered.  The excessive beta activity seen in the background is most likely due to the effect of benzodiazepine and is a benign EEG pattern. Patient was noted to have myoclonic seizures every few seconds.  Additionally there was evidence of severe to profound diffuse  encephalopathy.  In the setting of cardiac arrest, this EEG pattern is suggestive of anoxic/hypoxic brain injury.  Dr. was notified.  A normal interictal EEG does not exclude nor support the diagnosis of epilepsy.   Cherie Lasalle Annabelle Harman

## 2022-11-05 NOTE — Progress Notes (Addendum)
STAT LTM EEG hooked up and running - no initial skin breakdown - push button tested - [facility] monitoring. MR compatible leads placed.

## 2022-11-05 NOTE — ED Triage Notes (Addendum)
Patient POV in the back of a car with friends actively having a seizure. Per friends, patient had 6 seizures last night. Unknown if patient has taken any drugs. Unresponsive and eventually intubated. Clothing cut of patient. Patient smells of weed. Friend left after patient pulled from car.

## 2022-11-05 NOTE — ED Notes (Signed)
[facility] called for transport. 

## 2022-11-06 ENCOUNTER — Inpatient Hospital Stay (HOSPITAL_COMMUNITY): Payer: Federal, State, Local not specified - PPO

## 2022-11-06 DIAGNOSIS — G40901 Epilepsy, unspecified, not intractable, with status epilepticus: Secondary | ICD-10-CM | POA: Diagnosis not present

## 2022-11-06 DIAGNOSIS — R4182 Altered mental status, unspecified: Secondary | ICD-10-CM | POA: Diagnosis not present

## 2022-11-06 DIAGNOSIS — R569 Unspecified convulsions: Secondary | ICD-10-CM | POA: Diagnosis not present

## 2022-11-06 DIAGNOSIS — J9690 Respiratory failure, unspecified, unspecified whether with hypoxia or hypercapnia: Secondary | ICD-10-CM | POA: Diagnosis not present

## 2022-11-06 LAB — CBC
HCT: 40.2 % (ref 39.0–52.0)
Hemoglobin: 13.4 g/dL (ref 13.0–17.0)
MCH: 28.7 pg (ref 26.0–34.0)
MCHC: 33.3 g/dL (ref 30.0–36.0)
MCV: 86.1 fL (ref 80.0–100.0)
Platelets: 237 10*3/uL (ref 150–400)
RBC: 4.67 MIL/uL (ref 4.22–5.81)
RDW: 12.8 % (ref 11.5–15.5)
WBC: 8.6 10*3/uL (ref 4.0–10.5)
nRBC: 0 % (ref 0.0–0.2)

## 2022-11-06 LAB — POCT I-STAT 7, (LYTES, BLD GAS, ICA,H+H)
Acid-Base Excess: 1 mmol/L (ref 0.0–2.0)
Bicarbonate: 23.9 mmol/L (ref 20.0–28.0)
Calcium, Ion: 1.23 mmol/L (ref 1.15–1.40)
HCT: 38 % — ABNORMAL LOW (ref 39.0–52.0)
Hemoglobin: 12.9 g/dL — ABNORMAL LOW (ref 13.0–17.0)
O2 Saturation: 99 %
Patient temperature: 37.4
Potassium: 3.5 mmol/L (ref 3.5–5.1)
Sodium: 140 mmol/L (ref 135–145)
TCO2: 25 mmol/L (ref 22–32)
pCO2 arterial: 32.1 mmHg (ref 32–48)
pH, Arterial: 7.481 — ABNORMAL HIGH (ref 7.35–7.45)
pO2, Arterial: 136 mmHg — ABNORMAL HIGH (ref 83–108)

## 2022-11-06 LAB — PHOSPHORUS: Phosphorus: 2.9 mg/dL (ref 2.5–4.6)

## 2022-11-06 LAB — BASIC METABOLIC PANEL
Anion gap: 11 (ref 5–15)
BUN: 8 mg/dL (ref 6–20)
CO2: 23 mmol/L (ref 22–32)
Calcium: 9 mg/dL (ref 8.9–10.3)
Chloride: 106 mmol/L (ref 98–111)
Creatinine, Ser: 1.07 mg/dL (ref 0.61–1.24)
GFR, Estimated: >60 mL/min (ref >60)
Glucose, Bld: 86 mg/dL (ref 70–99)
Potassium: 3.5 mmol/L (ref 3.5–5.1)
Sodium: 140 mmol/L (ref 135–145)

## 2022-11-06 LAB — MAGNESIUM: Magnesium: 1.9 mg/dL (ref 1.7–2.4)

## 2022-11-06 LAB — TRIGLYCERIDES: Triglycerides: 72 mg/dL (ref <150)

## 2022-11-06 MED ORDER — LEVETIRACETAM IN NACL 500 MG/100ML IV SOLN
500.0000 mg | Freq: Two times a day (BID) | INTRAVENOUS | Status: DC
  Administered 2022-11-06 (×2): 500 mg via INTRAVENOUS
  Filled 2022-11-06 (×2): qty 100

## 2022-11-06 MED ORDER — NICOTINE POLACRILEX 2 MG MT GUM: 2.0000 mg | CHEWING_GUM | OROMUCOSAL | Status: DC | PRN

## 2022-11-06 MED ORDER — LORAZEPAM 2 MG/ML IJ SOLN
1.0000 mg | INTRAMUSCULAR | Status: DC | PRN
  Filled 2022-11-06: qty 1

## 2022-11-06 MED ORDER — LEVETIRACETAM 500 MG PO TABS
500.0000 mg | ORAL_TABLET | Freq: Two times a day (BID) | ORAL | Status: DC
  Administered 2022-11-07: 500 mg via ORAL
  Filled 2022-11-06: qty 1

## 2022-11-06 MED ORDER — NICOTINE 21 MG/24HR TD PT24
21.0000 mg | MEDICATED_PATCH | Freq: Every day | TRANSDERMAL | Status: DC
  Administered 2022-11-06 – 2022-11-08 (×2): 21 mg via TRANSDERMAL
  Filled 2022-11-06 (×3): qty 1

## 2022-11-06 MED ORDER — LORAZEPAM 2 MG/ML IJ SOLN
INTRAMUSCULAR | Status: AC
  Administered 2022-11-06: 1 mg
  Filled 2022-11-06: qty 1

## 2022-11-06 MED ORDER — CHLORHEXIDINE GLUCONATE CLOTH 2 % EX PADS
6.0000 | MEDICATED_PAD | Freq: Every day | CUTANEOUS | Status: DC
  Administered 2022-11-06 – 2022-11-07 (×2): 6 via TOPICAL

## 2022-11-06 MED ORDER — AMMONIA AROMATIC IN INHA
1.0000 | Freq: Once | RESPIRATORY_TRACT | Status: AC
  Administered 2022-11-07: 1 via RESPIRATORY_TRACT
  Filled 2022-11-06: qty 10

## 2022-11-06 NOTE — Progress Notes (Addendum)
NEUROLOGY CONSULTATION PROGRESS NOTE   Date of service: November 06, 2022 Patient Name: Stephen Underwood MRN:  [id number] DOB:  07-01-2001  Brief HPI  Stephen Underwood is a 21 y.o. male with unknown medical history who was brought in to the ED by his friends with a GCS of 5. Friends reported 6 seizures lastnight and patient noted to smell of weed. He was intubated in the ED for failure to protect his airway. In the ED, he was noted to be actively seizing.   He was given Ativan x 2, Keppra 60mg /Kg and started on propofol. UDS positive for cocaine, Benzo and THC. EtOH negative. CTH with no acute intracranial abnormalities.   No obvious clinical seizure activity noted. Transferred to [facility] for a cEEG to rule out subclinical status. No relatives/family at bedside   Interval Hx  Patient is intubated and sedated. cEEG has been on since 12/19 am; suggestive of severe diffuse encephalopathy, study is negative for seizures.  Vitals   Vitals:   11/06/22 0745 11/06/22 0800 11/06/22 0811 11/06/22 0815  BP:   120/89   Pulse: 69  91 80  Resp: 16  16 16   Temp:  99.3 F (37.4 C)    TempSrc:  Axillary    SpO2: 100%  100% 100%  Weight:      Height:         Body mass index is 17.33 kg/m.  Physical Exam   General: Critically-ill male lying in bed, no acute distress.  HENT: Normal oropharynx and mucosa. Normal external appearance of ears and nose.  Neck: Supple, no pain or tenderness  CV: No JVD. No peripheral edema.  Pulmonary: Symmetric Chest rise. Intubated, breathing over vent.  Abdomen: Soft to touch, non-tender.  Ext: No cyanosis, edema, or deformity  Skin: No rash. Normal palpation of skin.   Musculoskeletal: Normal digits and nails by inspection. No clubbing.   Neurologic Examination   Mental status/Cognition: obtunded mentation, Grimaces to tactile stimulation.  Does not open eyes to voice, does not follow commands, no obvious attempts to communicate. Speech/language: Intubated,  obtunded mentation which significantly limits speech evaluation.  Does not follow commands.   Cranial nerves:  Pupils unequal with R/2 and L/3. Sluggish response to bright light.  Does not blink to threat.Corneal present.  Positive cough, positive gag.  Unable to assess head turn, tongue protrusion, sensation due to intubation/sedation.    Motor/sensory:  Muscle bulk: normal, tone normal Unable to do detailed motor strength testing secondary to obtunded mentation. With noxious stimuli, patient localizes with BUE. Withdraws to noxious BLE, with both knees bending up with strength against resistance.    Coordination/Complex Motor:  Unable to assess  Labs   Basic Metabolic Panel:  Lab Results  Component Value Date   NA 140 11/06/2022   K 3.5 11/06/2022   CO2 23 11/06/2022   GLUCOSE 86 11/06/2022   BUN 8 11/06/2022   CREATININE 1.07 11/06/2022   CALCIUM 9.0 11/06/2022   GFRNONAA >60 11/06/2022   HbA1c: No results found for: "HGBA1C" LDL: No results found for: "LDLCALC" Urine Drug Screen:     Component Value Date/Time   LABOPIA NONE DETECTED 11/05/2022 1210   COCAINSCRNUR POSITIVE (A) 11/05/2022 1210   LABBENZ POSITIVE (A) 11/05/2022 1210   AMPHETMU NONE DETECTED 11/05/2022 1210   THCU POSITIVE (A) 11/05/2022 1210   LABBARB NONE DETECTED 11/05/2022 1210    Alcohol Level     Component Value Date/Time   ETH <10 11/05/2022 1205  No results found for: "PHENYTOIN", "ZONISAMIDE", "LAMOTRIGINE", "LEVETIRACETA" No results found for: "PHENYTOIN", "PHENOBARB", "VALPROATE", "CBMZ"  Imaging and Diagnostic studies  Results for orders placed during the hospital encounter of 11/05/22  CT Head Wo Contrast  IMPRESSION: No acute intracranial abnormalities.  LTM EEG: Severe diffuse encephalopathy but no seizures.  Impression   Stephen Underwood is a 21 y.o. male with unknown medical history who was brought in to the ED by his friends actively seizing and multiple seizures  overnight on 12/19.   He was given Ativan x 2, Keppra 60mg /Kg and started on propofol. UDS positive for cocaine, Benzo and THC. EtOH negative. CTH with no acute intracranial abnormalities. Vitals and labs not concerning for meningitis and no obvious nuchal rigidity noted on exam. Labs with no significant electrolyte abnormalities.   Presentation is concerning for seizure clustering versus status epilepticus in the setting of polysubstance abuse.  He has been on continuous EEG for about 12 hours which is so far been negative for seizures.  We will keep him on Keppra 500 mg twice daily for about a week while cocaine is cleared from his body.  Plan is to wean sedation today and try extubation.  Recommendations  - wean sedation as tolerated - ok with extubation from neuro standpoint - continue LTM EEG while weaning, possible dc if no seizures seen  - Ativan 2mg  PRN for any seizure activity. - MRI Brain w/o contrast. - seizure precautions. - Keppra 500mg  BID for a week, then stop. ______________________________________________________________________     , DNP, AGACNP-BC [facility] Please use AMION for pager and EPIC for messaging   NEUROHOSPITALIST ADDENDUM Performed a face to face diagnostic evaluation.   I have reviewed the contents of history and physical exam as documented by PA/ARNP/Resident and agree with above documentation.  I have discussed and formulated the above plan as documented. Edits to the note have been made as needed.  , MD [facility]   If 7pm to 7am, please call on call as listed on AMION.

## 2022-11-06 NOTE — Progress Notes (Signed)
Pt Neuro exam waxes and wanes with periods of no movement to painful stimulus, tachypnea and tachycardia. These episodes resolve and patient is A/O x4 and follows command. Neuro MD aware of these episodes. RN continue to monitor.

## 2022-11-06 NOTE — Progress Notes (Signed)
NAME:  Stephen Underwood, MRN:  [id number], DOB:  Nov 01, 2001, LOS: 1 ADMISSION DATE:  11/05/2022, CONSULTATION DATE:  11/05/22 REFERRING MD:  Karene Fry CHIEF COMPLAINT:  Seizures   History of Present Illness:  Stephen Underwood is a 21 y.o. male who has a PMH of anxiety. His chart is currently marked as Stephen Underwood; however, it has not yet been merged with his true chart Stephen Underwood DOB [birth year]). He was dropped off by friends in car outside of [facility] ED 12/19 with active seizures. Friends informed ED staff that he apparently "did a couple of perks last night and had at least 6 seizures".  In ED, he continued to seize and was therefore intubated for airway protection. Head CT was negative. UDS before benzo administration was positive for cocaine, benzos, THC. Labs were normal.  Neurology was consulted and pt was loaded with 1.5g Keppra. EEG has been ordered. They are recommending admission to [facility] for EEG and ongoing seizure workup and management.  PCCM called for admission.  Pertinent  Medical History:  has Status epilepticus (HCC) and Chronic respiratory failure with hypoxia (HCC) on their problem list.  Significant Hospital Events: Including procedures, antibiotic start and stop dates in addition to other pertinent events   12/19 admit, cEEG in place  Interim History / Subjective:   No acute events overnight. Intermittent agitation per nursing No seizure activity on cEEG report. Neuro recommends weaning sedation.  Objective:  Blood pressure 120/89, pulse 80, temperature 99.3 F (37.4 C), temperature source Axillary, resp. rate 16, height 6\' 2"  (1.88 m), weight 61.2 kg, SpO2 100 %.    Vent Mode: PRVC FiO2 (%):  [30 %-100 %] 30 % Set Rate:  [16 bmp] 16 bmp Vt Set:  [570 mL-650 mL] 570 mL PEEP:  [5 cmH20] 5 cmH20 Plateau Pressure:  [12 cmH20-17 cmH20] 16 cmH20   Intake/Output Summary (Last 24 hours) at 11/06/2022 1029 Last data filed at 11/06/2022 0600 Gross per 24 hour  Intake  1407.66 ml  Output 915 ml  Net 492.66 ml   Filed Weights   11/05/22 1219  Weight: 61.2 kg    Examination: General: Young adult male, resting in bed, sedated/intubated Neuro: sedated, spontaneous movements of all extremities HEENT: [place]/AT. Sclerae anicteric. Pupils dilated bilaterally but equal and responsive to light. ETT in place Cardiovascular: RRR, no M/R/G.  Lungs: Respirations even and unlabored.  CTA bilaterally, No W/R/R. Abdomen: BS x 4, soft, NT/ND.  Musculoskeletal: No gross deformities, no edema.  Skin: Multiple tattoos. Intact, warm, no rashes.  Labs/imaging personally reviewed:  CT head 12/19 > neg EEG 12/19 >   Assessment & Plan:  Status Epilepticus Acute Encephalopathy - no known hx but presume drug induced given that UDS was positive for cocaine, benzos, THC (collected before any benzo administration in ED). Ethanol negative. - Neuro following, appreciate the assistance. - Plant to wean sedation and monitor for break through seizure activity - EEG per neuro. - Continue keppra 500mg  BID per neuro  Acute hypoxic respiratory failure with inability to protect the airway - 2/2 above. S/p intubation in ED. - Full vent support. - Bronchial hygiene. - SBT when mental status allows. - decrease respiratory rate to 12 from 16  Polysubstance Abuse - drug screen positive for THC, benzos and cocaine - will need counseling when able  Best practice (evaluated daily):  Diet/type: NPO DVT prophylaxis: prophylactic heparin  GI prophylaxis: PPI Lines: N/A Foley:  N/A Code Status:  full code Last date of multidisciplinary goals  of care discussion: None yet.  Labs   CBC: Recent Labs  Lab 11/05/22 1205 11/05/22 1720 11/06/22 0444 11/06/22 0547  WBC 9.2  --   --  8.6  NEUTROABS 5.4  --   --   --   HGB 15.2 14.3 12.9* 13.4  HCT 45.9 42.0 38.0* 40.2  MCV 87.6  --   --  86.1  PLT 268  --   --  237    Basic Metabolic Panel: Recent Labs  Lab 11/05/22 1205  11/05/22 1720 11/06/22 0444 11/06/22 0547  NA 140 142 140 140  K 4.1 3.3* 3.5 3.5  CL 105  --   --  106  CO2 29  --   --  23  GLUCOSE 101*  --   --  86  BUN 7  --   --  8  CREATININE 0.91  --   --  1.07  CALCIUM 9.2  --   --  9.0  MG 2.4  --   --  1.9  PHOS 3.0  --   --  2.9   GFR: Estimated Creatinine Clearance: 93.7 mL/min (by C-G formula based on SCr of 1.07 mg/dL). Recent Labs  Lab 11/05/22 1205 11/06/22 0547  WBC 9.2 8.6    Liver Function Tests: Recent Labs  Lab 11/05/22 1205  AST 15  ALT 11  ALKPHOS 77  BILITOT 0.6  PROT 7.7  ALBUMIN 3.9   No results for input(s): "LIPASE", "AMYLASE" in the last 168 hours. Recent Labs  Lab 11/05/22 1248  AMMONIA 16    ABG    Component Value Date/Time   PHART 7.481 (H) 11/06/2022 0444   PCO2ART 32.1 11/06/2022 0444   PO2ART 136 (H) 11/06/2022 0444   HCO3 23.9 11/06/2022 0444   TCO2 25 11/06/2022 0444   O2SAT 99 11/06/2022 0444     Coagulation Profile: Recent Labs  Lab 11/05/22 1205  INR 1.0    Cardiac Enzymes: No results for input(s): "CKTOTAL", "CKMB", "CKMBINDEX", "TROPONINI" in the last 168 hours.  HbA1C: No results found for: "HGBA1C"  CBG: No results for input(s): "GLUCAP" in the last 168 hours.   Critical care time: 35 min.   Melody Comas, MD [facility] Office: [phone]   See Amion for personal pager PCCM on call pager [phone] until 7pm. Please call Elink 7p-7a. [phone]

## 2022-11-06 NOTE — Procedures (Signed)
Extubation Procedure Note  Patient Details:   Name: Stephen Underwood DOB: 07/25/2001 MRN: [id number]   Airway Documentation:    Vent end date: 11/06/22 Vent end time: 1255   Evaluation  O2 sats: stable throughout Complications: No apparent complications Patient did tolerate procedure well. Bilateral Breath Sounds: Clear, Diminished   Yes  Hiram Comber 11/06/2022, 1:00 PM

## 2022-11-06 NOTE — Progress Notes (Signed)
Pt reports he intentionally took Xanax and percocet as a suicide attempt. Dr. Francine Graven made aware. Order for [employer] and psych consult.

## 2022-11-06 NOTE — Progress Notes (Signed)
eLink Physician-Brief Progress Note Patient Name: Stephen Underwood DOB: 07/04/2001 MRN: [id number]   Date of Service  11/06/2022  HPI/Events of Note  On day shift patient had several episodes of waxing and waning neuro status with no movement to painful stimulus, tachypnea and tachycardia. These episodes then resolve and patient is A/O x 4 and follows command.  Neuro was notified on days with no change in intervention. Also event button pressed on EEG monitor a few times during these episodes no seizure reported.  NOW pt having this episode but is no longer A&O & is having episodes of apnea lasting up to 45 secs. O2 sats drop to about 89%. [place] 2L  HR 110-130's, RR 10-12, BP 150's  eICU Interventions  EEG tech called again and no seizure reported No electrolyte abnormality from this morning labs that could explain events Already on AEDs Neurology to be informed Discussed with bedside RN     Intervention Category Intermediate Interventions: Change in mental status - evaluation and management  Darl Pikes 11/06/2022, 8:10 PM

## 2022-11-06 NOTE — Procedures (Signed)
Patient Name: Stephen Underwood  MRN: [id number]  Epilepsy Attending: Charlsie Quest  Referring Physician/Provider: Erick Blinks, MD   Duration: 11/05/2022 2028 to 11/06/2022 2028   Patient history: 21 year old male initially presenting to the emergency department as a [name] who was dropped off by friends in the ED parking lot actively seizing. Per his companion's, "he did a couple of perks last night and had at least 6 seizures last night." EEG to evaluate for seizure.   Level of alertness: comatose   AEDs during EEG study: Versed, Propofol, LEV   Technical aspects: This EEG study was done with scalp electrodes positioned according to the 10-20 International system of electrode placement. Electrical activity was reviewed with band pass filter of 1-70Hz , sensitivity of 7 uV/mm, display speed of 52mm/sec with a 60Hz  notched filter applied as appropriate. EEG data were recorded continuously and digitally stored.  Video monitoring was available and reviewed as appropriate.   Description: At the beginning of the study, patient was on propofol and Versed.  EEG showed continuous generalized 3 to 6 Hz theta-delta slowing admixed with an excessive amount of 15 to 18 Hz beta activity distributed symmetrically and diffusely. As sedation was weaned, EEG showed posterior dominant rhythm of 9-10 Hz activity of moderate voltage (25-35 uV) seen predominantly in posterior head regions, symmetric and reactive to eye opening and eye closing. Sleep was characterized by vertex waves, sleep spindles (12 to 14 Hz), maximal frontocentral region. Hyperventilation and photic stimulation were not performed.     Event button was pressed on 10/18/2022 at 1130,1210, 1249, 14.3, 1408, 1545 and 1853 during which patient was not responding. Concomitant EEG before, during and after the event did not show any EEG to suggest seizure.   Event button was pressed on 11/06/2022 at 2007. Patient was laying in bed with eyes closed  and whole-body violent shaking. Concomitant EEG before, during and after the event did not show any EEG to suggest seizure.  ABNORMALITY - Continuous slow, generalized - Excessive beta, generalized   IMPRESSION: This study was initially suggestive of severe diffuse encephalopathy, nonspecific etiology but likely related to sedation.  As sedation was weaned, EEG improved and was within normal limits.  No seizures or epileptiform discharges were seen throughout the recording.  Multiple events were recorded as noted above without concomitant EEG change.  These events were nonepileptic.   Stephen Underwood 2009

## 2022-11-06 NOTE — Progress Notes (Signed)
RT attempted to wean patient x3 this am with out success. Pt breathes over set rate when in [facility] mode, but then goes apneic on CPAP/PSV. Pt placed back on full support at this time. RT will continue to monitor.

## 2022-11-07 ENCOUNTER — Encounter (HOSPITAL_COMMUNITY): Payer: Self-pay | Admitting: Pulmonary Disease

## 2022-11-07 ENCOUNTER — Other Ambulatory Visit: Payer: Self-pay

## 2022-11-07 DIAGNOSIS — G40901 Epilepsy, unspecified, not intractable, with status epilepticus: Secondary | ICD-10-CM | POA: Diagnosis not present

## 2022-11-07 LAB — URINALYSIS, ROUTINE W REFLEX MICROSCOPIC
Bilirubin Urine: NEGATIVE
Glucose, UA: NEGATIVE mg/dL
Hgb urine dipstick: NEGATIVE
Ketones, ur: 80 mg/dL — AB
Leukocytes,Ua: NEGATIVE
Nitrite: NEGATIVE
Protein, ur: NEGATIVE mg/dL
Specific Gravity, Urine: 1.026 (ref 1.005–1.030)
pH: 5 (ref 5.0–8.0)

## 2022-11-07 LAB — HEPATIC FUNCTION PANEL
ALT: 7 U/L (ref 0–44)
AST: 12 U/L — ABNORMAL LOW (ref 15–41)
Albumin: 3.6 g/dL (ref 3.5–5.0)
Alkaline Phosphatase: 75 U/L (ref 38–126)
Bilirubin, Direct: 0.2 mg/dL (ref 0.0–0.2)
Indirect Bilirubin: 1.3 mg/dL — ABNORMAL HIGH (ref 0.3–0.9)
Total Bilirubin: 1.5 mg/dL — ABNORMAL HIGH (ref 0.3–1.2)
Total Protein: 6.7 g/dL (ref 6.5–8.1)

## 2022-11-07 LAB — CK: Total CK: 127 U/L (ref 49–397)

## 2022-11-07 NOTE — Progress Notes (Signed)
NAME:  Stephen Underwood, MRN:  [id number], DOB:  2001-11-07, LOS: 2 ADMISSION DATE:  11/05/2022, CONSULTATION DATE:  11/05/22 REFERRING MD:  Karene Fry CHIEF COMPLAINT:  Seizures   History of Present Illness:  Stephen Underwood is a 21 y.o. male who has a PMH of anxiety. His chart is currently marked as Stephen Underwood; however, it has not yet been merged with his true chart Stephen Underwood DOB 2001-07-10). He was dropped off by friends in car outside of [facility] ED 12/19 with active seizures. Friends informed ED staff that he apparently "did a couple of perks last night and had at least 6 seizures".  In ED, he continued to seize and was therefore intubated for airway protection. Head CT was negative. UDS before benzo administration was positive for cocaine, benzos, THC. Labs were normal.  Neurology was consulted and pt was loaded with 1.5g Keppra. EEG has been ordered. They are recommending admission to [facility] for EEG and ongoing seizure workup and management.  PCCM called for admission.  Pertinent  Medical History:  has Status epilepticus (HCC) and Chronic respiratory failure with hypoxia (HCC) on their problem list.  Significant Hospital Events: Including procedures, antibiotic start and stop dates in addition to other pertinent events   12/19 admit, cEEG in place 12/21 extubated Interim History / Subjective:   Extubated yesterday without issue Has intermittent pseudopseizures, no seizure activity noted on cEEG Neuro plans to stop AEDs Psych planning to see patient today  Objective:  Blood pressure 120/74, pulse 72, temperature 98.1 F (36.7 C), temperature source Oral, resp. rate 17, height 6\' 2"  (1.88 m), weight 61.2 kg, SpO2 98 %.    Vent Mode: PRVC FiO2 (%):  [30 %] 30 % Set Rate:  [16 bmp] 16 bmp Vt Set:  [570 mL] 570 mL PEEP:  [5 cmH20] 5 cmH20 Plateau Pressure:  [15 cmH20-16 cmH20] 15 cmH20   Intake/Output Summary (Last 24 hours) at 11/07/2022 11/09/2022 Last data filed at 11/06/2022  1600 Gross per 24 hour  Intake 726.56 ml  Output 350 ml  Net 376.56 ml   Filed Weights   11/05/22 1219  Weight: 61.2 kg    Examination: General: Young adult male, resting in bed Neuro: moving all extremities, following commands HEENT: [place]/AT. Sclerae anicteric. PERRL Cardiovascular: RRR, no M/R/G.  Lungs: Respirations even and unlabored.  CTA bilaterally, No W/R/R. Abdomen: BS x 4, soft, NT/ND.  Musculoskeletal: No gross deformities, no edema.  Skin: Multiple tattoos. Intact, warm, no rashes.  Labs/imaging personally reviewed:  CT head 12/19 > neg EEG 12/19 >   Assessment & Plan:  Pseudoseizures Acute Encephalopathy - no known hx but presume drug induced given that UDS was positive for cocaine, benzos, THC (collected before any benzo administration in ED). Ethanol negative. - Neuro following, appreciate the assistance. - AEDS discontinued - Psych consulted  Suicide Attempt - psych consulted - 1:1 sitter  Acute hypoxic respiratory failure with inability to protect the airway - 2/2 above. S/p intubation in ED. - extubated 12/20 - wean off supplemental O2  Polysubstance Abuse - drug screen positive for THC, benzos and cocaine - will need counseling when able  Best practice (evaluated daily):  Diet/type: Regular consistency (see orders) DVT prophylaxis: prophylactic heparin  GI prophylaxis: PPI Lines: N/A Foley:  N/A Code Status:  full code Last date of multidisciplinary goals of care discussion: None yet.  Labs   CBC: Recent Labs  Lab 11/05/22 1205 11/05/22 1720 11/06/22 0444 11/06/22 0547  WBC 9.2  --   --  8.6  NEUTROABS 5.4  --   --   --   HGB 15.2 14.3 12.9* 13.4  HCT 45.9 42.0 38.0* 40.2  MCV 87.6  --   --  86.1  PLT 268  --   --  237    Basic Metabolic Panel: Recent Labs  Lab 11/05/22 1205 11/05/22 1720 11/06/22 0444 11/06/22 0547  NA 140 142 140 140  K 4.1 3.3* 3.5 3.5  CL 105  --   --  106  CO2 29  --   --  23  GLUCOSE 101*  --   --   86  BUN 7  --   --  8  CREATININE 0.91  --   --  1.07  CALCIUM 9.2  --   --  9.0  MG 2.4  --   --  1.9  PHOS 3.0  --   --  2.9   GFR: Estimated Creatinine Clearance: 93.7 mL/min (by C-G formula based on SCr of 1.07 mg/dL). Recent Labs  Lab 11/05/22 1205 11/06/22 0547  WBC 9.2 8.6    Liver Function Tests: Recent Labs  Lab 11/05/22 1205  AST 15  ALT 11  ALKPHOS 77  BILITOT 0.6  PROT 7.7  ALBUMIN 3.9   No results for input(s): "LIPASE", "AMYLASE" in the last 168 hours. Recent Labs  Lab 11/05/22 1248  AMMONIA 16    ABG    Component Value Date/Time   PHART 7.481 (H) 11/06/2022 0444   PCO2ART 32.1 11/06/2022 0444   PO2ART 136 (H) 11/06/2022 0444   HCO3 23.9 11/06/2022 0444   TCO2 25 11/06/2022 0444   O2SAT 99 11/06/2022 0444     Coagulation Profile: Recent Labs  Lab 11/05/22 1205  INR 1.0    Cardiac Enzymes: No results for input(s): "CKTOTAL", "CKMB", "CKMBINDEX", "TROPONINI" in the last 168 hours.  HbA1C: No results found for: "HGBA1C"  CBG: No results for input(s): "GLUCAP" in the last 168 hours.   Critical care time: n/a   Melody Comas, MD  Pulmonary & Critical Care Office: [phone]   See Amion for personal pager PCCM on call pager [phone] until 7pm. Please call Elink 7p-7a. [phone]

## 2022-11-07 NOTE — Consult Note (Signed)
[facility] Face-to-Face Psychiatry Consult   Reason for Consult: Suicide attempt by overdose Referring Physician: Dr. Francine Graven Patient Identification: Stephen Underwood MRN:  [id number] Principal Diagnosis: Status epilepticus [name]) Diagnosis:  Principal Problem:   Status epilepticus (HCC) Active Problems:   Chronic respiratory failure with hypoxia (HCC)   Total Time spent with patient: 1.5 hours  Subjective:   Stephen Underwood is a 21 y.o. male patient admitted with status epilepticus and unconscious.  Patient was intubated in the emergency room to protect his airway.  Upon extubation patient bluntly states " this was a suicide attempt".  He has notified to providers his current attending and staff nurse of suicide attempt.  Psychiatric consult was placed for suicide attempt by overdose.  Patient is seen and assessed by the psychiatric nurse practitioner, consulted with Dr. Gasper Sells, and primary team.  Patient does admit to recent suicide attempt by overdose of medication.  He states" I just wanted to be gone leave from here.  I did not want to be here or exist anymore that night.  I invited my ex-fianc over, in order to scare her and shake her up by attempting suicide in front of her.  However she said do not do it, and did not intervene.  So I took the pills."  He endorses taking 3 Xanax bars, which he believes were 5 mg each " they are pressing multiple medications together Xanax and Ativan Klonopin.  I took 3 white pills with 3 slits in the middle.  Followed by 1 Percocet 25 mg."  Patient is questioned about dosage of medications, however he is adamant about his supply.  Reports he has purchased medication from someone in [place] that is pressing pills, cutting them with PCP, ecstasy, fentanyl to make them better.  In addition to the Xanax, Percocet; he further reports taking ecstasy and cocaine in which he snorted.  Outside of his suicide attempt he does engage in recreational substance use.   He  identifies recent stressors as separation from his significant other.  He does not identify any additional stressors and or triggers that resulted in suicide attempt.  He does report planning this out the day of, and deciding to go through with it.  He reports it being an impulsive attempt, however at the time he wanted to end his life.  Prior to the separation he reports a normal mood.  He denies any acute symptoms of depression, mania, psychosis, trauma prior to the separation.  His mother agrees that he was psychiatrically stable up until he separated.  He reports this as his first suicide attempt in his lifetime.  Denies any history of self-harm behaviors, suicide ideations, suicidal thoughts.  He further denies any history of violence, combativeness, aggression, and or pending legal charges.  He does consider himself a closed off " antisocial, not open to talking to people.  Only reason I am talking to you at this time is because my mom is here."  Patient currently resides with his mother and 64 year old sister.  He does have 4 paternal siblings ages 42, 92, 71, 51.  His father is currently incarcerated for aggravated murder.  He endorses having a family history of father and brother with suspected antisocial personality disorder.  Mother reports self diagnosis of depression and anxiety currently stable and maternal aunt with history of substance use requiring multiple inpatient hospitalizations and inpatient rehabilitation.  Collateral obtained from mom includes behavioral problems from elementary through high school.  Patient was diagnosed with ADD  during middle school, in which she finally responded to Vyvanse after several attempts and trials.  She reports during high school due to his behaviors he was enrolled in [employer], and subsequently went back to [employer].  She is very concerned about his substance use and his most recent suicide attempt, and agrees with inpatient psychiatric  hospitalization.  Did discuss with mother the importance of increased voluntary inpatient psychiatric hospitalization versus involuntary.  Did review with mother course of expectation and what involuntary commitment process consist of.  She will continue to work with him to get him to agree to go voluntarily.  However she is agreeing with involuntary commitment in the event he chooses otherwise.  Support, encouragement, and reassurance were offered.  In regards to patient's suicide risk assessment he has multiple risk factors placing him at high risk for suicide completion.  At this time will recommend inpatient psychiatric hospitalization for crisis stabilization, medication management, and behavioral therapy.  Did review course of expectation, plan, treatment care regimen and visitation.  Throughout the interview he does remain focused on his ex-fianc, and initiate in contact with her.  He is advised he will be unable to call her during this hospital stay, however will have access to a telephone once he is admitted to behavioral health Hospital.    HPI:    Past Psychiatric History: He reports a previous psychiatric history of ADD.  Was previously treated with Vyvanse after failed attempts and multiple stimulant medications.  Mother reports his medications were ineffective due to his maternal aunt tampering with and overt and those pills at that time.  Risk to Self:  Denies, however admitted for suicide attempt by overdose. Risk to Others:  Denies Prior Inpatient Therapy:  Denies Prior Outpatient Therapy:  Psychologist and psychiatrist for management of ADD.  Past Medical History: No past medical history on file.  Family History: No family history on file. Family Psychiatric  History: See above.  Mother diagnosis of anxiety and depression, maternal aunt diagnosed with substance abuse requiring multiple inpatient and psychiatric hospitalization and rehabilitation.  Father and paternal brother  suspected antisocial personality disorder. Social History:  Social History   Substance and Sexual Activity  Alcohol Use Not on file     Social History   Substance and Sexual Activity  Drug Use Not on file    Social History   Socioeconomic History   Marital status: Single    Spouse name: Not on file   Number of children: Not on file   Years of education: Not on file   Highest education level: Not on file  Occupational History   Not on file  Tobacco Use   Smoking status: Not on file   Smokeless tobacco: Not on file  Substance and Sexual Activity   Alcohol use: Not on file   Drug use: Not on file   Sexual activity: Not on file  Other Topics Concern   Not on file  Social History Narrative   Not on file   Social Determinants of Health   Financial Resource Strain: Not on file  Food Insecurity: Not on file  Transportation Needs: Not on file  Physical Activity: Not on file  Stress: Not on file  Social Connections: Not on file   Additional Social History:    Allergies:  No Known Allergies  Labs:  Results for orders placed or performed during the hospital encounter of 11/05/22 (from the past 48 hour(s))  I-STAT 7, (LYTES, BLD GAS, ICA,  H+H)     Status: Abnormal   Collection Time: 11/06/22  4:44 AM  Result Value Ref Range   pH, Arterial 7.481 (H) 7.35 - 7.45   pCO2 arterial 32.1 32 - 48 mmHg   pO2, Arterial 136 (H) 83 - 108 mmHg   Bicarbonate 23.9 20.0 - 28.0 mmol/L   TCO2 25 22 - 32 mmol/L   O2 Saturation 99 %   Acid-Base Excess 1.0 0.0 - 2.0 mmol/L   Sodium 140 135 - 145 mmol/L   Potassium 3.5 3.5 - 5.1 mmol/L   Calcium, Ion 1.23 1.15 - 1.40 mmol/L   HCT 38.0 (L) 39.0 - 52.0 %   Hemoglobin 12.9 (L) 13.0 - 17.0 g/dL   Patient temperature 37.4 C    Collection site RADIAL, ALLEN'S TEST ACCEPTABLE    Drawn by RT    Sample type ARTERIAL   CBC     Status: None   Collection Time: 11/06/22  5:47 AM  Result Value Ref Range   WBC 8.6 4.0 - 10.5 K/uL   RBC 4.67  4.22 - 5.81 MIL/uL   Hemoglobin 13.4 13.0 - 17.0 g/dL   HCT 40.2 39.0 - 52.0 %   MCV 86.1 80.0 - 100.0 fL   MCH 28.7 26.0 - 34.0 pg   MCHC 33.3 30.0 - 36.0 g/dL   RDW 12.8 11.5 - 15.5 %   Platelets 237 150 - 400 K/uL   nRBC 0.0 0.0 - 0.2 %    Comment: Performed at [facility], [place] [address] [place], [place] [id number]  Basic metabolic panel     Status: None   Collection Time: 11/06/22  5:47 AM  Result Value Ref Range   Sodium 140 135 - 145 mmol/L   Potassium 3.5 3.5 - 5.1 mmol/L   Chloride 106 98 - 111 mmol/L   CO2 23 22 - 32 mmol/L   Glucose, Bld 86 70 - 99 mg/dL    Comment: Glucose reference range applies only to samples taken after fasting for at least 8 hours.   BUN 8 6 - 20 mg/dL   Creatinine, Ser 1.07 0.61 - 1.24 mg/dL   Calcium 9.0 8.9 - 10.3 mg/dL   GFR, Estimated >60 >60 mL/min    Comment: (NOTE) Calculated using the CKD-EPI Creatinine Equation (2021)    Anion gap 11 5 - 15    Comment: Performed at [facility] [address], [place], [place] [address]  Magnesium     Status: None   Collection Time: 11/06/22  5:47 AM  Result Value Ref Range   Magnesium 1.9 1.7 - 2.4 mg/dL    Comment: Performed at [facility] [address] [place], [place] [address]  Phosphorus     Status: None   Collection Time: 11/06/22  5:47 AM  Result Value Ref Range   Phosphorus 2.9 2.5 - 4.6 mg/dL    Comment: Performed at [facility] [address], [place], [place] [address]  Triglycerides     Status: None   Collection Time: 11/06/22  5:47 AM  Result Value Ref Range   Triglycerides 72 <150 mg/dL    Comment: Performed at [facility] [address] [place], [place] [address]  Hepatic function panel     Status: Abnormal   Collection Time: 11/07/22 11:46 AM  Result Value Ref Range   Total Protein 6.7 6.5 - 8.1 g/dL   Albumin 3.6 3.5 - 5.0 g/dL   AST  12 (L) 15 - 41 U/L   ALT 7 0 - 44 U/L   Alkaline Phosphatase 75 38 - 126 U/L   Total Bilirubin 1.5  (H) 0.3 - 1.2 mg/dL   Bilirubin, Direct 0.2 0.0 - 0.2 mg/dL   Indirect Bilirubin 1.3 (H) 0.3 - 0.9 mg/dL    Comment: Performed at [facility] [address], [place], [place] [address]  CK     Status: None   Collection Time: 11/07/22 11:46 AM  Result Value Ref Range   Total CK 127 49 - 397 U/L    Comment: Performed at [facility], [place] [address], [place], [place] [address]  Urinalysis, Routine w reflex microscopic Urine, Clean Catch     Status: Abnormal   Collection Time: 11/07/22  5:21 PM  Result Value Ref Range   Color, Urine YELLOW YELLOW   APPearance CLEAR CLEAR   Specific Gravity, Urine 1.026 1.005 - 1.030   pH 5.0 5.0 - 8.0   Glucose, UA NEGATIVE NEGATIVE mg/dL   Hgb urine dipstick NEGATIVE NEGATIVE   Bilirubin Urine NEGATIVE NEGATIVE   Ketones, ur 80 (A) NEGATIVE mg/dL   Protein, ur NEGATIVE NEGATIVE mg/dL   Nitrite NEGATIVE NEGATIVE   Leukocytes,Ua NEGATIVE NEGATIVE    Comment: Performed at [facility] [address] [place], [place] [address]    Current Facility-Administered Medications  Medication Dose Route Frequency Provider Last Rate Last Admin   Chlorhexidine Gluconate Cloth 2 % PADS 6 each  6 each Topical Daily Hunsucker, Bonna Gains, MD   6 each at 11/07/22 1720   docusate sodium (COLACE) capsule 100 mg  100 mg Oral BID PRN Shearon Stalls, Rahul P, PA-C       heparin injection 5,000 Units  5,000 Units Subcutaneous Q8H Desai, Rahul P, PA-C   5,000 Units at 11/07/22 1445   lactated ringers infusion   Intravenous Continuous Desai, Rahul P, PA-C   Stopped at 11/06/22 1341   LORazepam (ATIVAN) injection 1-2 mg  1-2 mg Intravenous Q4H PRN Beulah Gandy A, NP       nicotine (NICODERM CQ - dosed in mg/24 hours) patch 21 mg  21 mg Transdermal Daily Donnetta Simpers, MD   21 mg at 11/06/22 1409   nicotine polacrilex (NICORETTE) gum 2 mg  2 mg Oral PRN Donnetta Simpers, MD       Oral care mouth rinse  15 mL Mouth Rinse PRN Hunsucker, Bonna Gains, MD        polyethylene glycol (MIRALAX / GLYCOLAX) packet 17 g  17 g Oral Daily PRN Germain Osgood, PA-C        Musculoskeletal: Strength & Muscle Tone: within normal limits Gait & Station: normal Patient leans: N/A            Psychiatric Specialty Exam:  Presentation  General Appearance:  Appropriate for Environment; Casual  Eye Contact: Good  Speech: Clear and Coherent; Normal Rate  Speech Volume: Normal  Handedness: Right   Mood and Affect  Mood: Anxious  Affect: Depressed; Tearful; Restricted   Thought Process  Thought Processes: Linear; Coherent  Descriptions of Associations:Intact  Orientation:Full (Time, Place and Person)  Thought Content:WDL  History of Schizophrenia/Schizoaffective disorder:No data recorded Duration of Psychotic Symptoms:No data recorded Hallucinations:Hallucinations: None  Ideas of Reference:None  Suicidal Thoughts:Suicidal Thoughts: Yes, Active SI Active Intent and/or Plan: With Intent; With Plan; With Means to [place]; With Access to Means  Homicidal Thoughts:Homicidal Thoughts: No   Sensorium  Memory:  Immediate Good; Recent Good; Remote Good  Judgment: Fair  Insight: Poor   Executive Functions  Concentration: Good  Attention Span: Good  Recall: Good  Fund of Knowledge: Good  Language: Good   Psychomotor Activity  Psychomotor Activity: Psychomotor Activity: Normal   Assets  Assets: Communication Skills; Financial Resources/Insurance; Physical Health; Social Support; Talents/Skills; Housing   Sleep  Sleep: Sleep: Good   Physical Exam: Physical Exam Vitals and nursing note reviewed.  Constitutional:      Appearance: Normal appearance. He is normal weight.  Neurological:     Mental Status: He is alert.    ROS Blood pressure 123/89, pulse 64, temperature 98.7 F (37.1 C), temperature source Axillary, resp. rate (!) 22, height 6\' 2"  (1.88 m), weight 61.2 kg, SpO2 94 %. Body mass  index is 17.33 kg/m.  Treatment Plan Summary: Daily contact with patient to assess and evaluate symptoms and progress in treatment -Will benefit from COWS and CIWA, as he endorses use of benzodiazepine and opiates. -Patient continuing to receive neurology workup, will refrain from adding any new and or previous psychotropic medication at this time until medical workup is complete.  -At this time we will recommend inpatient psychiatric hospitalization.  Will continue to encourage patient to proceed voluntarily with inpatient psychiatric hospitalization.  In the event he refuses to do so, and or attempts to leave AMA will need to place under involuntary commitment.  Disposition: Recommend psychiatric Inpatient admission when medically cleared.  Suella Broad, FNP 11/07/2022 7:53 PM

## 2022-11-07 NOTE — Progress Notes (Addendum)
NEUROLOGY CONSULTATION PROGRESS NOTE   Date of service: November 07, 2022 Patient Name: Stephen Underwood MRN:  [id number] DOB:  06-20-2001  Brief HPI  Stephen Underwood is a 21 y.o. male with unknown medical history who was brought in to the ED by his friends with a GCS of 5. Friends reported 6 seizures lastnight and patient noted to smell of weed. He was intubated in the ED for failure to protect his airway. In the ED, he was noted to be actively seizing.   He was given Ativan x 2, Keppra 60mg /Kg and started on propofol. UDS positive for cocaine, Benzo and THC. EtOH negative. CTH with no acute intracranial abnormalities.   No obvious clinical seizure activity noted. Transferred to [facility] for a cEEG to rule out subclinical status. No relatives/family at bedside   Interval Hx  Extubated, endorses that substance use an an intentional overdose and suicide attempt.  Had seizure like episode yesterday with no EEG correlate and were non epileptic.  Endorses that he has had seizure like episodes over the last year?  Vitals   Vitals:   11/07/22 0300 11/07/22 0400 11/07/22 0600 11/07/22 0800  BP: (!) 111/54 131/72 120/74   Pulse: 72 76 72   Resp: 16 16 17    Temp:  98.1 F (36.7 C)  98 F (36.7 C)  TempSrc:  Oral  Oral  SpO2: 97% 99% 98%   Weight:      Height:         Body mass index is 17.33 kg/m.  Physical Exam    General: Laying comfortably in bed; in no acute distress.  HENT: Normal oropharynx and mucosa. Normal external appearance of ears and nose.  Neck: Supple, no pain or tenderness  CV: No JVD. No peripheral edema.  Pulmonary: Symmetric Chest rise. Normal respiratory effort.  Abdomen: Soft to touch, non-tender.  Ext: No cyanosis, edema, or deformity  Skin: No rash. Normal palpation of skin.   Musculoskeletal: Normal digits and nails by inspection. No clubbing.   Neurologic Examination  Mental status/Cognition: Alert, oriented to self, place, month and year, good attention.   Speech/language: Fluent, comprehension intact, object naming intact, repetition intact. Cranial nerves:   CN II Pupils equal and reactive to light, no VF deficits    CN III,IV,VI EOM intact, no gaze preference or deviation, no nystagmus    CN V normal sensation in V1, V2, and V3 segments bilaterally    CN VII no asymmetry, no nasolabial fold flattening    CN VIII normal hearing to speech    CN IX & X normal palatal elevation, no uvular deviation   CN XI 5/5 head turn and 5/5 shoulder shrug bilaterally    CN XII midline tongue protrusion   Motor:  Muscle bulk: normal, tone normal, pronator drift none tremor none Mvmt Root Nerve  Muscle Right Left Comments  SA C5/6 Ax Deltoid 5 5   EF C5/6 Mc Biceps 5 5   EE C6/7/8 Rad Triceps 5 5   WF C6/7 Med FCR     WE C7/8 PIN ECU     F Ab C8/T1 U ADM/FDI 5 5   HF L1/2/3 Fem Illopsoas 5 5   KE L2/3/4 Fem Quad 5 5   DF L4/5 D Peron Tib Ant 5 5   PF S1/2 Tibial Grc/Sol 5 5    Sensation:  Light touch Intact throughout   Pin prick    Temperature    Vibration   Proprioception  Coordination/Complex Motor:  - Finger to Nose intact BL - Heel to shin intact BL - Rapid alternating movement are normal - Gait: deferred.  Labs   Basic Metabolic Panel:  Lab Results  Component Value Date   NA 140 11/06/2022   K 3.5 11/06/2022   CO2 23 11/06/2022   GLUCOSE 86 11/06/2022   BUN 8 11/06/2022   CREATININE 1.07 11/06/2022   CALCIUM 9.0 11/06/2022   GFRNONAA >60 11/06/2022   HbA1c: No results found for: "HGBA1C" LDL: No results found for: "LDLCALC" Urine Drug Screen:     Component Value Date/Time   LABOPIA NONE DETECTED 11/05/2022 1210   COCAINSCRNUR POSITIVE (A) 11/05/2022 1210   LABBENZ POSITIVE (A) 11/05/2022 1210   AMPHETMU NONE DETECTED 11/05/2022 1210   THCU POSITIVE (A) 11/05/2022 1210   LABBARB NONE DETECTED 11/05/2022 1210    Alcohol Level     Component Value Date/Time   ETH <10 11/05/2022 1205   No results found for:  "PHENYTOIN", "ZONISAMIDE", "LAMOTRIGINE", "LEVETIRACETA" No results found for: "PHENYTOIN", "PHENOBARB", "VALPROATE", "CBMZ"  Imaging and Diagnostic studies  Results for orders placed during the hospital encounter of 11/05/22  CT Head Wo Contrast  IMPRESSION: No acute intracranial abnormalities.  LTM EEG: Severe diffuse encephalopathy but no seizures. Event button was pressed on 11/07/2022 at 0442. Patient was laying in bed with eyes closed and whole-body violent shaking. Concomitant EEG before, during and after the event did not show any EEG to suggest seizure.   Event button was pressed on 11/07/2022 at 0829. Patient was laying in bed with eyes closed and had subtle shaking. Concomitant EEG before, during and after the event did not show any EEG to suggest seizure.  Impression   Stephen Underwood is a 21 y.o. male with unknown medical history who was brought in to the ED by his friends actively seizing and multiple seizures overnight on 12/19 and presentation concerning for status epilepticus in the setting of polysubstance use.   He was given Ativan x 2, Keppra 60mg /Kg and started on propofol. UDS positive for cocaine, Benzo and THC. EtOH negative. CTH with no acute intracranial abnormalities. Vitals and labs not concerning for meningitis and no obvious nuchal rigidity noted on exam. Labs with no significant electrolyte abnormalities.   No extubated and had events while on LTM EEG and characterized as non epileptic spells.   Recommendations  - follow up with outpatient neurology. - discontinued Keppra. - we will signoff. _____________________________________________________________________  Neurology will sign off. Please recall consutl with any questions or concerns.    , DNP, AGACNP-BC [facility] Please use AMION for pager and EPIC for messaging   NEUROHOSPITALIST ADDENDUM Performed a face to face diagnostic evaluation.   I have reviewed the  contents of history and physical exam as documented by PA/ARNP/Resident and agree with above documentation.  I have discussed and formulated the above plan as documented. Edits to the note have been made as needed.  Lynnae January, MD [facility] Erick Blinks   If 7pm to 7am, please call on call as listed on AMION.

## 2022-11-07 NOTE — Progress Notes (Signed)
vLTM discontinued  No skin nreakdonw noted  [facility] notified

## 2022-11-07 NOTE — Procedures (Addendum)
atient Name: Stephen Underwood  MRN: [id number]  Epilepsy Attending: Charlsie Quest  Referring Physician/Provider: Erick Blinks, MD   Duration: 11/06/2022 2028 to 11/07/2022 1310   Patient history: 21 year old male initially presenting to the emergency department as a Stephen Underwood who was dropped off by friends in the ED parking lot actively seizing. Per his companion's, "he did a couple of perks last night and had at least 6 seizures last night." EEG to evaluate for seizure.   Level of alertness: awake, asleep   AEDs during EEG study:  LEV   Technical aspects: This EEG study was done with scalp electrodes positioned according to the 10-20 International system of electrode placement. Electrical activity was reviewed with band pass filter of 1-70Hz , sensitivity of 7 uV/mm, display speed of 77mm/sec with a 60Hz  notched filter applied as appropriate. EEG data were recorded continuously and digitally stored.  Video monitoring was available and reviewed as appropriate.   Description: EEG showed posterior dominant rhythm of 9-10 Hz activity of moderate voltage (25-35 uV) seen predominantly in posterior head regions, symmetric and reactive to eye opening and eye closing. Sleep was characterized by vertex waves, sleep spindles (12 to 14 Hz), maximal frontocentral region. Hyperventilation and photic stimulation were not performed.      Event button was pressed on 11/07/2022 at 0442. Patient was laying in bed with eyes closed and whole-body violent shaking. Concomitant EEG before, during and after the event did not show any EEG to suggest seizure.  Event button was pressed on 11/07/2022 at 0829. Patient was laying in bed with eyes closed and had subtle shaking. Concomitant EEG before, during and after the event did not show any EEG to suggest seizure.    IMPRESSION: This study was within normal limits.  No seizures or epileptiform discharges were seen throughout the recording.   One event was recorded  on 11/07/2022 at 0442 as described above without concomitant EEG change.  This was a nonepileptic event.     Therisa Mennella 11/09/2022

## 2022-11-07 NOTE — Progress Notes (Signed)
LTM maint complete - no skin breakdown under:  FP1, FP2   

## 2022-11-08 ENCOUNTER — Encounter (HOSPITAL_COMMUNITY): Payer: Self-pay | Admitting: Pulmonary Disease

## 2022-11-08 ENCOUNTER — Inpatient Hospital Stay (HOSPITAL_COMMUNITY): Payer: Federal, State, Local not specified - PPO

## 2022-11-08 ENCOUNTER — Inpatient Hospital Stay (HOSPITAL_COMMUNITY)
Admission: AD | Admit: 2022-11-08 | Discharge: 2022-11-11 | DRG: 885 | Disposition: A | Discharge status: Home / Self Care | Payer: Federal, State, Local not specified - PPO | Source: Intra-hospital | Attending: Psychiatry | Admitting: Psychiatry

## 2022-11-08 DIAGNOSIS — T424X2D Poisoning by benzodiazepines, intentional self-harm, subsequent encounter: Secondary | ICD-10-CM | POA: Diagnosis not present

## 2022-11-08 DIAGNOSIS — F1910 Other psychoactive substance abuse, uncomplicated: Secondary | ICD-10-CM

## 2022-11-08 DIAGNOSIS — G40909 Epilepsy, unspecified, not intractable, without status epilepticus: Secondary | ICD-10-CM | POA: Diagnosis present

## 2022-11-08 DIAGNOSIS — T43642D Poisoning by ecstasy, intentional self-harm, subsequent encounter: Secondary | ICD-10-CM

## 2022-11-08 DIAGNOSIS — J9611 Chronic respiratory failure with hypoxia: Secondary | ICD-10-CM | POA: Diagnosis present

## 2022-11-08 DIAGNOSIS — F332 Major depressive disorder, recurrent severe without psychotic features: Secondary | ICD-10-CM

## 2022-11-08 DIAGNOSIS — Z1152 Encounter for screening for COVID-19: Secondary | ICD-10-CM

## 2022-11-08 DIAGNOSIS — F909 Attention-deficit hyperactivity disorder, unspecified type: Secondary | ICD-10-CM | POA: Diagnosis present

## 2022-11-08 DIAGNOSIS — Z79899 Other long term (current) drug therapy: Secondary | ICD-10-CM

## 2022-11-08 DIAGNOSIS — F17210 Nicotine dependence, cigarettes, uncomplicated: Secondary | ICD-10-CM | POA: Diagnosis present

## 2022-11-08 DIAGNOSIS — T405X2D Poisoning by cocaine, intentional self-harm, subsequent encounter: Secondary | ICD-10-CM | POA: Diagnosis not present

## 2022-11-08 DIAGNOSIS — K824 Cholesterolosis of gallbladder: Secondary | ICD-10-CM | POA: Diagnosis not present

## 2022-11-08 DIAGNOSIS — K5900 Constipation, unspecified: Secondary | ICD-10-CM | POA: Diagnosis present

## 2022-11-08 DIAGNOSIS — T391X2D Poisoning by 4-Aminophenol derivatives, intentional self-harm, subsequent encounter: Secondary | ICD-10-CM

## 2022-11-08 DIAGNOSIS — R109 Unspecified abdominal pain: Secondary | ICD-10-CM | POA: Diagnosis not present

## 2022-11-08 DIAGNOSIS — G40901 Epilepsy, unspecified, not intractable, with status epilepticus: Secondary | ICD-10-CM | POA: Diagnosis not present

## 2022-11-08 HISTORY — DX: Major depressive disorder, recurrent severe without psychotic features: F33.2

## 2022-11-08 LAB — RESP PANEL BY RT-PCR (RSV, FLU A&B, COVID)  RVPGX2
Influenza A by PCR: NEGATIVE
Influenza B by PCR: NEGATIVE
Resp Syncytial Virus by PCR: NEGATIVE
SARS Coronavirus 2 by RT PCR: NEGATIVE

## 2022-11-08 MED ORDER — MAGNESIUM HYDROXIDE 400 MG/5ML PO SUSP: 30.0000 mL | Freq: Every day | ORAL | Status: DC | PRN

## 2022-11-08 MED ORDER — ACETAMINOPHEN 325 MG PO TABS: 650.0000 mg | ORAL_TABLET | Freq: Four times a day (QID) | ORAL | Status: DC | PRN

## 2022-11-08 MED ORDER — RISPERIDONE 2 MG PO TBDP: 2.0000 mg | ORAL_TABLET | Freq: Three times a day (TID) | ORAL | Status: DC | PRN

## 2022-11-08 MED ORDER — LORAZEPAM 1 MG PO TABS: 1.0000 mg | ORAL_TABLET | ORAL | Status: DC | PRN

## 2022-11-08 MED ORDER — NICOTINE 21 MG/24HR TD PT24: 21.0000 mg | MEDICATED_PATCH | Freq: Every day | TRANSDERMAL | 0 refills | Status: DC

## 2022-11-08 MED ORDER — TRAZODONE HCL 50 MG PO TABS
50.0000 mg | ORAL_TABLET | Freq: Every evening | ORAL | Status: DC | PRN
  Administered 2022-11-09 – 2022-11-10 (×2): 50 mg via ORAL
  Filled 2022-11-08 (×2): qty 1

## 2022-11-08 MED ORDER — ZIPRASIDONE MESYLATE 20 MG IM SOLR: 20.0000 mg | INTRAMUSCULAR | Status: DC | PRN

## 2022-11-08 MED ORDER — ALUM & MAG HYDROXIDE-SIMETH 200-200-20 MG/5ML PO SUSP
30.0000 mL | ORAL | Status: DC | PRN
  Administered 2022-11-09: 30 mL via ORAL
  Filled 2022-11-08: qty 30

## 2022-11-08 MED ORDER — DOCUSATE SODIUM 100 MG PO CAPS: 100.0000 mg | ORAL_CAPSULE | Freq: Two times a day (BID) | ORAL | 0 refills | Status: DC | PRN

## 2022-11-08 MED ORDER — PNEUMOCOCCAL 20-VAL CONJ VACC 0.5 ML IM SUSY: 0.5000 mL | PREFILLED_SYRINGE | INTRAMUSCULAR | Status: DC

## 2022-11-08 MED ORDER — POLYETHYLENE GLYCOL 3350 17 G PO PACK: 17.0000 g | PACK | Freq: Every day | ORAL | 0 refills | Status: DC | PRN

## 2022-11-08 NOTE — TOC Transition Note (Addendum)
Transition of Care [facility]) - CM/SW Discharge Note   Patient Details  Name: Stephen Underwood MRN: [id number] Date of Birth: 22-Dec-2000  Transition of Care [facility] CM/SW Contact:  Lockie Pares, RN Phone Number: 11/08/2022, 3:50 PM   Clinical Narrative:      Patient negative for COVID, FLU etc. [facility] will admit him 5pm tonight He is medically cleared. Message to RN, provider, other personnel pertinent to discussion.   Call report to [phone] . Safe transport to be called .  Safe transportation number is [phone]. Called and no answer, left a detailed messaged to get back to this [facility] quickly [phone] Called colleague who suggested [facility]. [facility] cannot take . Called [facility], TOC leadership.  No other forms of transport available that are known.  Messaged team for clarity. [facility] NP states mother or family cannot drive him there. Concern for him not following through, may IVC patient. IF IVC would be escorted to [facility] with Law enforcement    [facility] Barriers to Discharge: No Barriers Identified   Patient Goals and CMS Choice      Discharge Placement    IP [facility]                     Discharge Plan and Services Additional resources added to the After Visit Summary for                                       Social Determinants of Health (SDOH) Interventions SDOH Screenings   Food Insecurity: No Food Insecurity (11/07/2022)  Housing: Medium Risk (11/07/2022)  Transportation Needs: No Transportation Needs (11/07/2022)  Utilities: Not At Risk (11/07/2022)  Tobacco Use: Medium Risk (11/08/2022)     Readmission Risk Interventions     No data to display

## 2022-11-08 NOTE — Consult Note (Signed)
Pt was accepted to [facility]  22/Dec/2023  at 1700; Bed Assignment 400-1   Pt meets inpatient criteria per Caryn Bee, NP   Attending Physician will be Dr. Sherron Flemings   DX: Major depressive disorder, severe   Report can be called to: - Adult unit:  [phone]   Pt can arrive after 1700   Please fax Vol(IVC) paperwork to [phone]-must have both before the patient can arrive at [facility].    Care team communication made with the following: Sharyne Peach, [facility] Ardeen Fillers, Staff RN Gilda Crease, LCSW, Thad Ranger, MD, Garvin Fila, RN  Caryn Bee, NP.,  Gretta Cool, MD  Discharge readmitted orders have been placed.  Will order as needed agitation protocol

## 2022-11-08 NOTE — Progress Notes (Signed)
Patient escorted with staff member to lobby for discharge to [facility].  Safe Transport staff member confirmed patient information for transport.  Family educated on discharge to [facility].  Provided family with information for nurse's station and visitation policy.

## 2022-11-08 NOTE — Discharge Summary (Addendum)
Physician Discharge Summary   Patient: Stephen Underwood MRN: [id number] DOB: [birth year]  Admit date:     11/05/2022  Discharge date: 11/08/22  Discharge Physician: Thad Ranger, MD    PCP: [facility]   Recommendations at discharge:   Discharge to inpatient psych  Discharge Diagnoses:  Acute encephalopathy with pseudoseizures Acute respiratory failure with hypoxia with inability protect airway Suicide attempt/ideation Polysubstance abuse    Hospital Course:  Patient is a 21 year old male with a history of anxiety, was dropped off by friends in a car outside of [facility] on 5/19 with active seizures.Friends informed ED staff that he apparently "did a couple of perks last night and had at least 6 seizures". In ED, he continued to seize and was therefore intubated for airway protection. Head CT was negative. UDS before benzo administration was positive for cocaine, benzos, THC. Labs were normal.   Neurology was consulted and pt was loaded with 1.5g Keppra. EEG has been ordered.  Patient was admitted by [facility] for further workup. He was extubated on 12/21, transferred to the floor on 12/22, [facility] assumed care on 12/23  Assessment and Plan:  Acute encephalopathy with pseudoseizures -No known history but presumed to be drug-induced given the UDS was positive for cocaine and benzos, THC.  Ethanol negative. -Initially given loading dose of Keppra, vitals and labs not concerning for meningitis.   -EEG normal on 12/21, no seizures or epileptiform discharges seen -Neurology following, discontinued Keppra Psychiatry was consulted, recommended inpatient psychiatry hospitalization -Medically clear for discharge to inpatient psych.    Acute respiratory failure with hypoxia (HCC) with inability to protect airway -Likely due to #1, was intubated in the ED -Was extubated on 12/21, currently O2 sats 100% on RA   Suicide ideation/attempt -Patient had reported that he also took  a Xanax and Percocet as a suicide attempt - psychiatry was consulted, recommended inpatient psych when medically clear -Continue one-to-one sitter   Polysubstance abuse -Drug screen positive for THC, benzos, cocaine -Will need ongoing counseling - continue nicotine patch  TD daily       Pain control - [place] Controlled Substance Reporting System database was reviewed. and patient was instructed, not to drive, operate heavy machinery, perform activities at heights, swimming or participation in water activities or provide baby-sitting services while on Pain, Sleep and Anxiety Medications; until their outpatient Physician has advised to do so again. Also recommended to not to take more than prescribed Pain, Sleep and Anxiety Medications.  Consultants: Admitted by PCCM, neurology, psychiatry Procedures performed: Intubation, extubated Disposition: Inpatient psych Diet recommendation: Regular diet  DISCHARGE MEDICATION: Allergies as of 11/08/2022   No Known Allergies      Medication List     TAKE these medications    docusate sodium 100 MG capsule Commonly known as: COLACE Take 1 capsule (100 mg total) by mouth 2 (two) times daily as needed for mild constipation.   nicotine 21 mg/24hr patch Commonly known as: NICODERM CQ - dosed in mg/24 hours Place 1 patch (21 mg total) onto the skin daily. Start taking on: November 09, 2022   polyethylene glycol 17 g packet Commonly known as: MIRALAX / GLYCOLAX Take 17 g by mouth daily as needed for moderate constipation.         Discharge Exam: Filed Weights   11/05/22 1219 11/08/22 0500  Weight: 61.2 kg 64.9 kg   S: No acute complaints  BP 134/84 (BP Location: Left Arm)   Pulse 73  Temp 98.2 F (36.8 C)   Resp 18   Ht 6\' 2"  (1.88 m)   Wt 64.9 kg   SpO2 100%   BMI 18.37 kg/m   Physical Exam General: Lying comfortably in bed, sleepy but easily arousable and following commands Cardiovascular: S1 S2 clear,  RRR.  Respiratory: CTAB, no wheezing, rales or rhonchi Gastrointestinal: Soft, nontender, nondistended, NBS Ext: no pedal edema bilaterally Neuro: no new deficits Psych: flat affect   Condition at discharge: fair  The results of significant diagnostics from this hospitalization (including imaging, microbiology, ancillary and laboratory) are listed below for reference.   Imaging Studies: Abdomen Limited RUQ (LIVER/GB)  Result Date: 11/08/2022 CLINICAL DATA:  Abdominal pain EXAM: ULTRASOUND ABDOMEN LIMITED RIGHT UPPER QUADRANT COMPARISON:  None Available. FINDINGS: Gallbladder: Small amount of nonshadowing sludge in the gallbladder. No wall thickening or pericholecystic fluid. Negative sonographic Murphy sign. The gallbladder is mildly distended. Common bile duct: Diameter: 1.7 mm, normal.  No intrahepatic ductal dilation. Liver: No focal lesion identified. Within normal limits in parenchymal echogenicity. Portal vein is patent on color Doppler imaging with normal direction of blood flow towards the liver. Other: None. IMPRESSION: Small amount of intraluminal sludge in the gallbladder. No evidence of acute cholecystitis. No evidence of biliary obstruction. Normal sonographic appearance of the liver. Electronically Signed   By: 11/10/2022 M.D.   On: 11/08/2022 11:03   MR BRAIN WO CONTRAST  Result Date: 11/06/2022 CLINICAL DATA:  Mental status change.  Seizure. EXAM: MRI HEAD WITHOUT CONTRAST TECHNIQUE: Multiplanar, multiecho pulse sequences of the brain and surrounding structures were obtained without intravenous contrast. COMPARISON:  CT head 11/05/2022 FINDINGS: Brain: No acute infarction, hemorrhage, hydrocephalus, extra-axial collection or mass lesion. Normal white matter. Normal hippocampus and medial temporal lobe. Vascular: Normal arterial flow voids Skull and upper cervical spine: Negative Sinuses/Orbits: Mucosal edema paranasal sinuses. Mild mastoid effusion bilaterally Other: None  IMPRESSION: Normal MRI of the brain. Electronically Signed   By: 11/07/2022 M.D.   On: 11/06/2022 17:40   DG Chest Port 1 View  Result Date: 11/06/2022 CLINICAL DATA:  respiratory failure EXAM: PORTABLE CHEST - 1 VIEW COMPARISON:  11/05/2022 FINDINGS: Cardiac silhouette is unremarkable. No pneumothorax or pleural effusion. The lungs are clear. The visualized skeletal structures are unremarkable. Endotracheal tube tip at the thoracic inlet. NG tube tip below the diaphragm and off x-ray. IMPRESSION: Lungs are clear. Electronically Signed   By: 11/07/2022 M.D.   On: 11/06/2022 08:28   Overnight EEG with video  Result Date: 11/06/2022 11/08/2022, MD     11/07/2022  9:45 AM Patient Name: DELSIN COPEN MRN: Ellyn Hack Epilepsy Attending: [id number] Referring Physician/Provider: Charlsie Quest, MD  Duration: 11/05/2022 2028 to 11/06/2022 2028  Patient history: 21 year old male initially presenting to the emergency department as a 20 Doe who was dropped off by friends in the ED parking lot actively seizing. Per his companion's, "he did a couple of perks last night and had at least 6 seizures last night." EEG to evaluate for seizure.  Level of alertness: comatose  AEDs during EEG study: Versed, Propofol, LEV  Technical aspects: This EEG study was done with scalp electrodes positioned according to the 10-20 International system of electrode placement. Electrical activity was reviewed with band pass filter of 1-70Hz , sensitivity of 7 uV/mm, display speed of 19mm/sec with a 60Hz  notched filter applied as appropriate. EEG data were recorded continuously and digitally stored.  Video monitoring was available and reviewed as appropriate.  Description: At the beginning of the study, patient was on propofol and Versed.  EEG showed continuous generalized 3 to 6 Hz theta-delta slowing admixed with an excessive amount of 15 to 18 Hz beta activity distributed symmetrically and diffusely. As  sedation was weaned, EEG showed posterior dominant rhythm of 9-10 Hz activity of moderate voltage (25-35 uV) seen predominantly in posterior head regions, symmetric and reactive to eye opening and eye closing. Sleep was characterized by vertex waves, sleep spindles (12 to 14 Hz), maximal frontocentral region. Hyperventilation and photic stimulation were not performed.   Event button was pressed on 10/18/2022 at 1130,1210, 1249, 14.3, 1408, 1545 and 1853 during which patient was not responding. Concomitant EEG before, during and after the event did not show any EEG to suggest seizure. Event button was pressed on 11/06/2022 at 2007. Patient was laying in bed with eyes closed and whole-body violent shaking. Concomitant EEG before, during and after the event did not show any EEG to suggest seizure. ABNORMALITY - Continuous slow, generalized - Excessive beta, generalized  IMPRESSION: This study was initially suggestive of severe diffuse encephalopathy, nonspecific etiology but likely related to sedation.  As sedation was weaned, EEG improved and was within normal limits.  No seizures or epileptiform discharges were seen throughout the recording. Multiple events were recorded as noted above without concomitant EEG change.  These events were nonepileptic.  Charlsie Quest   CT Head Wo Contrast  Result Date: 11/05/2022 CLINICAL DATA:  New onset seizure, no history of trauma EXAM: CT HEAD WITHOUT CONTRAST TECHNIQUE: Contiguous axial images were obtained from the base of the skull through the vertex without intravenous contrast. RADIATION DOSE REDUCTION: This exam was performed according to the departmental dose-optimization program which includes automated exposure control, adjustment of the mA and/or kV according to patient size and/or use of iterative reconstruction technique. COMPARISON:  None Available. FINDINGS: Brain: Normal ventricular morphology. No midline shift or mass effect. Normal appearance of brain  parenchyma. No intracranial hemorrhage, mass lesion, evidence of acute infarction, or extra-axial fluid collection. Vascular: No hyperdense vessels Skull: Intact Sinuses/Orbits: Clear. Material question cerumen in external auditory canals bilaterally. Other: N/A IMPRESSION: No acute intracranial abnormalities. Electronically Signed   By: Ulyses Southward M.D.   On: 11/05/2022 12:47   DG Chest Port 1 View  Result Date: 11/05/2022 CLINICAL DATA:  Confusion.  Endotracheal intubation. EXAM: PORTABLE CHEST 1 VIEW COMPARISON:  None Available. FINDINGS: Endotracheal tube tip 4 cm above the carina. Heart size is normal. Mediastinal shadows are normal. The lungs are clear. No pneumothorax or hemothorax. No abnormal bone finding. IMPRESSION: Endotracheal tube tip 4 cm above the carina. No active cardiopulmonary disease. Electronically Signed   By: Paulina Fusi M.D.   On: 11/05/2022 12:34    Microbiology: Results for orders placed or performed during the hospital encounter of 11/05/22  Resp panel by RT-PCR (RSV, Flu A&B, Covid) Anterior Nasal Swab     Status: None   Collection Time: 11/05/22 12:23 PM   Specimen: Anterior Nasal Swab  Result Value Ref Range Status   SARS Coronavirus 2 by RT PCR NEGATIVE NEGATIVE Final    Comment: (NOTE) SARS-CoV-2 target nucleic acids are NOT DETECTED.  The SARS-CoV-2 RNA is generally detectable in upper respiratory specimens during the acute phase of infection. The lowest concentration of SARS-CoV-2 viral copies this assay can detect is 138 copies/mL. A negative result does not preclude SARS-Cov-2 infection and should not be used as the sole basis for treatment or other patient  management decisions. A negative result may occur with  improper specimen collection/handling, submission of specimen other than nasopharyngeal swab, presence of viral mutation(s) within the areas targeted by this assay, and inadequate number of viral copies(<138 copies/mL). A negative result must be  combined with clinical observations, patient history, and epidemiological information. The expected result is Negative.  Fact Sheet for Patients:  BloggerCourse.comhttps://www.fda.gov/media/152166/download  Fact Sheet for Healthcare Providers:  SeriousBroker.ithttps://www.fda.gov/media/152162/download  This test is no t yet approved or cleared by the [place] FDA and  has been authorized for detection and/or diagnosis of SARS-CoV-2 by FDA under an Emergency Use Authorization (EUA). This EUA will remain  in effect (meaning this test can be used) for the duration of the COVID-19 declaration under Section 564(b)(1) of the Act, 21 U.S.C.section 360bbb-3(b)(1), unless the authorization is terminated  or revoked sooner.       Influenza A by PCR NEGATIVE NEGATIVE Final   Influenza B by PCR NEGATIVE NEGATIVE Final    Comment: (NOTE) The Xpert Xpress SARS-CoV-2/FLU/RSV plus assay is intended as an aid in the diagnosis of influenza from Nasopharyngeal swab specimens and should not be used as a sole basis for treatment. Nasal washings and aspirates are unacceptable for Xpert Xpress SARS-CoV-2/FLU/RSV testing.  Fact Sheet for Patients: BloggerCourse.comhttps://www.fda.gov/media/152166/download  Fact Sheet for Healthcare Providers: SeriousBroker.ithttps://www.fda.gov/media/152162/download  This test is not yet approved or cleared by the [place] FDA and has been authorized for detection and/or diagnosis of SARS-CoV-2 by FDA under an Emergency Use Authorization (EUA). This EUA will remain in effect (meaning this test can be used) for the duration of the COVID-19 declaration under Section 564(b)(1) of the Act, 21 U.S.C. section 360bbb-3(b)(1), unless the authorization is terminated or revoked.     Resp Syncytial Virus by PCR NEGATIVE NEGATIVE Final    Comment: (NOTE) Fact Sheet for Patients: BloggerCourse.comhttps://www.fda.gov/media/152166/download  Fact Sheet for Healthcare Providers: SeriousBroker.ithttps://www.fda.gov/media/152162/download  This test is not yet  approved or cleared by the [place] FDA and has been authorized for detection and/or diagnosis of SARS-CoV-2 by FDA under an Emergency Use Authorization (EUA). This EUA will remain in effect (meaning this test can be used) for the duration of the COVID-19 declaration under Section 564(b)(1) of the Act, 21 U.S.C. section 360bbb-3(b)(1), unless the authorization is terminated or revoked.  Performed at [facility], [address] [place], [place] [address]   MRSA Next Gen by PCR, Nasal     Status: None   Collection Time: 11/05/22  5:16 PM   Specimen: Nasal Mucosa; Nasal Swab  Result Value Ref Range Status   MRSA by PCR Next Gen NOT DETECTED NOT DETECTED Final    Comment: (NOTE) The GeneXpert MRSA Assay (FDA approved for NASAL specimens only), is one component of a comprehensive MRSA colonization surveillance program. It is not intended to diagnose MRSA infection nor to guide or monitor treatment for MRSA infections. Test performance is not FDA approved in patients less than 22 years old. Performed at [facility], [address] [place], [place] [address]   Resp panel by RT-PCR (RSV, Flu A&B, Covid) Anterior Nasal Swab     Status: None   Collection Time: 11/08/22 11:09 AM   Specimen: Anterior Nasal Swab  Result Value Ref Range Status   SARS Coronavirus 2 by RT PCR NEGATIVE NEGATIVE Final    Comment: (NOTE) SARS-CoV-2 target nucleic acids are NOT DETECTED.  The SARS-CoV-2 RNA is generally detectable in upper respiratory specimens during the acute phase of infection. The  lowest concentration of SARS-CoV-2 viral copies this assay can detect is 138 copies/mL. A negative result does not preclude SARS-Cov-2 infection and should not be used as the sole basis for treatment or other patient management decisions. A negative result may occur with  improper specimen collection/handling, submission of specimen other than nasopharyngeal swab, presence of viral  mutation(s) within the areas targeted by this assay, and inadequate number of viral copies(<138 copies/mL). A negative result must be combined with clinical observations, patient history, and epidemiological information. The expected result is Negative.  Fact Sheet for Patients:  BloggerCourse.com  Fact Sheet for Healthcare Providers:  SeriousBroker.it  This test is no t yet approved or cleared by the [place] FDA and  has been authorized for detection and/or diagnosis of SARS-CoV-2 by FDA under an Emergency Use Authorization (EUA). This EUA will remain  in effect (meaning this test can be used) for the duration of the COVID-19 declaration under Section 564(b)(1) of the Act, 21 U.S.C.section 360bbb-3(b)(1), unless the authorization is terminated  or revoked sooner.       Influenza A by PCR NEGATIVE NEGATIVE Final   Influenza B by PCR NEGATIVE NEGATIVE Final    Comment: (NOTE) The Xpert Xpress SARS-CoV-2/FLU/RSV plus assay is intended as an aid in the diagnosis of influenza from Nasopharyngeal swab specimens and should not be used as a sole basis for treatment. Nasal washings and aspirates are unacceptable for Xpert Xpress SARS-CoV-2/FLU/RSV testing.  Fact Sheet for Patients: BloggerCourse.com  Fact Sheet for Healthcare Providers: SeriousBroker.it  This test is not yet approved or cleared by the [place] FDA and has been authorized for detection and/or diagnosis of SARS-CoV-2 by FDA under an Emergency Use Authorization (EUA). This EUA will remain in effect (meaning this test can be used) for the duration of the COVID-19 declaration under Section 564(b)(1) of the Act, 21 U.S.C. section 360bbb-3(b)(1), unless the authorization is terminated or revoked.     Resp Syncytial Virus by PCR NEGATIVE NEGATIVE Final    Comment: (NOTE) Fact Sheet for  Patients: BloggerCourse.com  Fact Sheet for Healthcare Providers: SeriousBroker.it  This test is not yet approved or cleared by the [place] FDA and has been authorized for detection and/or diagnosis of SARS-CoV-2 by FDA under an Emergency Use Authorization (EUA). This EUA will remain in effect (meaning this test can be used) for the duration of the COVID-19 declaration under Section 564(b)(1) of the Act, 21 U.S.C. section 360bbb-3(b)(1), unless the authorization is terminated or revoked.  Performed at [facility], [address] [place], [place] [address]     Labs: CBC: Recent Labs  Lab 11/05/22 1205 11/05/22 1720 11/06/22 0444 11/06/22 0547  WBC 9.2  --   --  8.6  NEUTROABS 5.4  --   --   --   HGB 15.2 14.3 12.9* 13.4  HCT 45.9 42.0 38.0* 40.2  MCV 87.6  --   --  86.1  PLT 268  --   --  237   Basic Metabolic Panel: Recent Labs  Lab 11/05/22 1205 11/05/22 1720 11/06/22 0444 11/06/22 0547  NA 140 142 140 140  K 4.1 3.3* 3.5 3.5  CL 105  --   --  106  CO2 29  --   --  23  GLUCOSE 101*  --   --  86  BUN 7  --   --  8  CREATININE 0.91  --   --  1.07  CALCIUM 9.2  --   --  9.0  MG 2.4  --   --  1.9  PHOS 3.0  --   --  2.9   Liver Function Tests: Recent Labs  Lab 11/05/22 1205 11/07/22 1146  AST 15 12*  ALT 11 7  ALKPHOS 77 75  BILITOT 0.6 1.5*  PROT 7.7 6.7  ALBUMIN 3.9 3.6   CBG: No results for input(s): "GLUCAP" in the last 168 hours.  Discharge time spent: greater than 30 minutes.  Signed: Thad Ranger, MD [facility] 11/08/2022

## 2022-11-08 NOTE — Progress Notes (Signed)
[facility]                                                                              Austin Broadwater, is a 21 y.o. male, DOB - [birth year], [id number] Admit date - 11/05/2022    Outpatient Primary MD for the patient is [facility] [place]  LOS - 3  days  Chief Complaint  Patient presents with   Seizures       Brief summary   Patient is a 21 year old male with a history of anxiety, was dropped off by friends in a car outside of [place] [facility] ED on 5/19 with active seizures.Friends informed ED staff that he apparently "did a couple of perks last night and had at least 6 seizures". In ED, he continued to seize and was therefore intubated for airway protection. Head CT was negative. UDS before benzo administration was positive for cocaine, benzos, THC. Labs were normal.   Neurology was consulted and pt was loaded with 1.5g Keppra. EEG has been ordered.  Patient was admitted by [facility] for further workup. He was extubated on 12/21, transferred to the floor on 12/22, [facility] assumed care on 12/23  Assessment & Plan    Principal Problem: Acute encephalopathy with pseudoseizures -No known history but presumed to be drug-induced given the UDS was positive for cocaine and benzos, THC.  Ethanol negative. -Initially given loading dose of Keppra, vitals and labs not concerning for meningitis.   -EEG normal on 12/21, no seizures or epileptiform discharges seen -Neurology following, discontinued [facility] was consulted, recommended inpatient psychiatry hospitalization -Medically clear for discharge to inpatient psych when bed available  Active Problems: Acute respiratory failure with hypoxia [facility]) with inability to protect airway -Likely due to #1, was intubated in the ED -Was extubated on 12/21, currently  Suicide ideation/attempt -Patient had reported that he also took a Xanax and Percocet as a suicide attempt - psychiatry was consulted, recommended inpatient  psych when medically clear -Continue one-to-one sitter  Polysubstance abuse -Drug screen positive for THC, benzos, cocaine -Will need ongoing counseling  Code Status: Full CODE STATUS DVT Prophylaxis:  heparin injection 5,000 Units Start: 11/05/22 1400 SCDs Start: 11/05/22 1321   Level of Care: Level of care: Progressive Family Communication: No family member at the bedside Disposition Plan:      Remains inpatient appropriate: Medically clear for discharge to inpatient psych when bed available  Procedures:  EEG  Consultants:   Neurology, psychiatry Admitted by PCCM  Antimicrobials: None    Medications  heparin  5,000 Units Subcutaneous Q8H   nicotine  21 mg Transdermal Daily   [START ON 11/09/2022] pneumococcal 20-valent conjugate vaccine  0.5 mL Intramuscular Tomorrow-1000      Subjective:   Hitoshi Borsch was seen and examined today.  No acute complaints, sleepy but easily arousable.  No acute seizures overnight.  No fevers, headache or blurry vision.  Objective:   Vitals:   11/08/22 0400 11/08/22 0500 11/08/22 0837 11/08/22 1108  BP: 112/71  (!) 104/57 124/80  Pulse: (!) 50  (!) 52 68  Resp: 18  18 18   Temp: 97.7 F (36.5 C)  98 F (  36.7 C) 98.3 F (36.8 C)  TempSrc: Oral  Oral   SpO2: 100%  99% 99%  Weight:  64.9 kg    Height:        Intake/Output Summary (Last 24 hours) at 11/08/2022 1115 Last data filed at 11/07/2022 1300 Gross per 24 hour  Intake 100 ml  Output --  Net 100 ml     Wt Readings from Last 3 Encounters:  11/08/22 64.9 kg     Exam General: Laying comfortably in bed, sleepy but easily arousable and following commands Cardiovascular: S1 S2 auscultated,  RRR Respiratory: CTAB Gastrointestinal: Soft, nontender, nondistended, + bowel sounds Ext: no pedal edema bilaterally Neuro: moving all 4 extremities spontaneously Psych: flat affect    Data Reviewed:  I have personally reviewed following labs    CBC Lab Results   Component Value Date   WBC 8.6 11/06/2022   RBC 4.67 11/06/2022   HGB 13.4 11/06/2022   HCT 40.2 11/06/2022   MCV 86.1 11/06/2022   MCH 28.7 11/06/2022   PLT 237 11/06/2022   MCHC 33.3 11/06/2022   RDW 12.8 11/06/2022   LYMPHSABS 3.0 11/05/2022   MONOABS 0.7 11/05/2022   EOSABS 0.1 11/05/2022   BASOSABS 0.0 123456     Last metabolic panel Lab Results  Component Value Date   NA 140 11/06/2022   K 3.5 11/06/2022   CL 106 11/06/2022   CO2 23 11/06/2022   BUN 8 11/06/2022   CREATININE 1.07 11/06/2022   GLUCOSE 86 11/06/2022   GFRNONAA >60 11/06/2022   CALCIUM 9.0 11/06/2022   PHOS 2.9 11/06/2022   PROT 6.7 11/07/2022   ALBUMIN 3.6 11/07/2022   BILITOT 1.5 (H) 11/07/2022   ALKPHOS 75 11/07/2022   AST 12 (L) 11/07/2022   ALT 7 11/07/2022   ANIONGAP 11 11/06/2022    CBG (last 3)  No results for input(s): "GLUCAP" in the last 72 hours.    Coagulation Profile: Recent Labs  Lab 11/05/22 1205  INR 1.0     Radiology Studies: I have personally reviewed the imaging studies  US Abdomen Limited RUQ (LIVER/GB)  Result Date: 11/08/2022 CLINICAL DATA:  Abdominal pain EXAM: ULTRASOUND ABDOMEN LIMITED RIGHT UPPER QUADRANT COMPARISON:  None Available. FINDINGS: Gallbladder: Small amount of nonshadowing sludge in the gallbladder. No wall thickening or pericholecystic fluid. Negative sonographic Murphy sign. The gallbladder is mildly distended. Common bile duct: Diameter: 1.7 mm, normal.  No intrahepatic ductal dilation. Liver: No focal lesion identified. Within normal limits in parenchymal echogenicity. Portal vein is patent on color Doppler imaging with normal direction of blood flow towards the liver. Other: None. IMPRESSION: Small amount of intraluminal sludge in the gallbladder. No evidence of acute cholecystitis. No evidence of biliary obstruction. Normal sonographic appearance of the liver. Electronically Signed   By: Maurine Simmering M.D.   On: 11/08/2022 11:03   MR BRAIN WO  CONTRAST  Result Date: 11/06/2022 CLINICAL DATA:  Mental status change.  Seizure. EXAM: MRI HEAD WITHOUT CONTRAST TECHNIQUE: Multiplanar, multiecho pulse sequences of the brain and surrounding structures were obtained without intravenous contrast. COMPARISON:  CT head 11/05/2022 FINDINGS: Brain: No acute infarction, hemorrhage, hydrocephalus, extra-axial collection or mass lesion. Normal white matter. Normal hippocampus and medial temporal lobe. Vascular: Normal arterial flow voids Skull and upper cervical spine: Negative Sinuses/Orbits: Mucosal edema paranasal sinuses. Mild mastoid effusion bilaterally Other: None IMPRESSION: Normal MRI of the brain. Electronically Signed   By: Franchot Gallo M.D.   On: 11/06/2022 17:40  Thad Ranger M.D. [facility] 11/08/2022, 11:15 AM  Available via Epic secure chat 7am-7pm After 7 pm, please refer to night coverage provider listed on amion.

## 2022-11-08 NOTE — Progress Notes (Signed)
This RN able to call [facility] and coordinate pick-up time for approximately 6PM. Updated [facility] receiving RN of pick-up time.

## 2022-11-08 NOTE — Progress Notes (Signed)
   11/08/22 1400  Clinical Encounter Type  Visited With Patient not available  Visit Type Initial;Other (Comment) (Advanced Directive)  Referral From Nurse  Consult/Referral To Chaplain   Chaplain responded to a spiritual consult for advanced directive education.  The patient, Stephen Underwood, was receiving care at the time of my visit.

## 2022-11-09 ENCOUNTER — Encounter (HOSPITAL_COMMUNITY): Payer: Self-pay | Admitting: Family

## 2022-11-09 DIAGNOSIS — F332 Major depressive disorder, recurrent severe without psychotic features: Secondary | ICD-10-CM | POA: Diagnosis not present

## 2022-11-09 MED ORDER — ONDANSETRON HCL 4 MG PO TABS
4.0000 mg | ORAL_TABLET | Freq: Three times a day (TID) | ORAL | Status: DC | PRN
  Administered 2022-11-09: 4 mg via ORAL
  Filled 2022-11-09: qty 1

## 2022-11-09 MED ORDER — MIRTAZAPINE 15 MG PO TBDP
15.0000 mg | ORAL_TABLET | Freq: Every day | ORAL | Status: DC
  Administered 2022-11-09 – 2022-11-10 (×2): 15 mg via ORAL
  Filled 2022-11-09 (×4): qty 1

## 2022-11-09 NOTE — H&P (Addendum)
Psychiatric Admission Assessment Adult  Patient Identification: Stephen Underwood  MRN:  [id number] Date of Evaluation:  11/09/2022  Chief Complaint:  MDD (major depressive disorder), recurrent episode, severe ([place]) [F33.2]  Principal Diagnosis: MDD (major depressive disorder), recurrent episode, severe ([place])  Diagnosis:  Principal Problem:   MDD (major depressive disorder), recurrent episode, severe ([place])  CC: Suicidal attempt by drug overdose (patient denies suicide attempt.  States that he was having fun at a party).  History of Present Illness: Stephen Underwood is a 21 year old male who presents voluntarily to [facility] from [facility] for recent suicide attempt by overdose on Xanax, Percocet, ecstasy, and cocaine.  Patient states that he attended a party and had too much fun when he overdosed on the illegal drugs.  Reports that this is his first attempt with these types of drugs however, it was not to commit suicide.  Reports using marijuana previously as his drug of choice however, not to harm himself.  He reports that his fiance dropped him off at the emergency room on 11/05/2022, because he was unresponsive from these drugs effect.  Patient has psychiatric diagnoses significant for major depressive disorder recurrent episode severe, and ADHD.  Also past medical history for chronic respiratory failure with hypoxia and status epilepticus.  He lives at home with his mother, dad and siblings.  Patient reports he was diagnosed with major depressive disorder severe when he was 21 years old and also he had history of ADHD that was being treated with multiple medications.  Finally Vyvanse was prescribed for a short period of time, and was discontinued by his PCP.  Patient vehemently denies use of illegal drugs except for marijuana smoking of 1 joint 1-2 times weekly for the past 2 years.  He reports working at [employer].  Labs reviewed CMP: AST 12 low,  indirect bilirubin 1.3 elevated, total bilirubin 1.5 elevated otherwise normal.  Lipid profile normal. CBC with differential: Normal.  Blood toxicology: Positive for cocaine.  Urine toxicology positive for benzodiazepine, cocaine, and tetrahydrocannabinol.  Instruction provided to patient on cessation of polysubstance usage due to negative impact on overall psychiatric and medical wellbeing.  Patient nodded in agreement.  Patient denies SI, HI, AVH, paranoia or intrusive thoughts.  He denies suicide attempt, prior admission, prior outpatient therapy,or prior psychotic symptoms.  However, endorses history of seizures.  He endorses decreased appetite and weight loss.  Initiated Remeron 15 mg p.o. q. nightly for depression and appetite stimulation.  Patient is admitted to [facility] for mood stabilization medication management and safety.    Mode of transport to Hospital: Safe transport  Current Outpatient (Home) Medication List: Colace 100 mg capsule by mouth 2 times daily for constipation. Nicotine patch 21 mg 1 patch onto the skin daily for smoking.    PRN medication prior to evaluation:MiraLAX/GlycoLax 17 g p.o. daily as needed for moderate constipation  ED course: Patient was intubated in the emergency room to protect his airway.  Upon extubation patient bluntly states this was a suicide attempt.  He notified providers and staff nurses of suicide attempts.  Psychiatry consult was placed for suicide attempt by overdose.  Collateral Information: Patient's mother Stephen Underwood at [phone] called for further information on patient's.  Patient mother reports that she is concerned about patient regarding drug use, however, cannot confirm his prior drug use.  Added that she knows patient smokes marijuana occasionally.  However very concerned about this current uses of  Xanax, Percocet, ecstasy, and cocaine.  Mother confirms that patient has never been admitted to behavioral  health Hospital, nor treated inpatient or outpatient, or been to rehabilitation for drug use.  POA/Legal Guardian: Stephen Underwood  Past Psychiatric Hx: Previous Psych Diagnoses: Major depressive disorder without psychotic features. Prior inpatient treatment: Not applicable Current/prior outpatient treatment: Not applicable Prior rehab hx: Not applicable Psychotherapy hx: Yes as a teenager History of suicide: Patient denies History of homicide or aggression: Patient denies Psychiatric medication history: Patient treated with Vyvanse in the past for ADD Psychiatric medication compliance history: Yes Neuromodulation history: Not applicable Current Psychiatrist: Not applicable Current therapist: Not applicable  Substance Abuse Hx: Alcohol: Patient denies Tobacco: Patient denies Illicit drugs: Patient reports taking Xanax, Percocet, ecstasy, and cocaine on 11/05/2022 Rx drug abuse: Patient denies, however, patient's mother is concerned about patient's substance use in the past and also his current suicide attempt. Rehab hx: Patient denies  Past Medical History: Medical Diagnoses: Major depressive disorder recurrent episode severe Home [id number] sodium Colace 100 mg capsule Prior Hosp: None Prior Surgeries/Trauma: None none head trauma: None, LOC, concussions, seizures: Patient has history of status epilepticus Allergies: No known allergies LMP: Not applicable Contraception: Not applicable PCP: Patient does not remember PCP  Family History: Medical: Mother diagnosed with anxiety and depression Psych: Mother diagnosed with anxiety and depression Psych Rx: Patient does not remember SA/HA: Not applicable Substance use family hx: Not applicable  Social History: Childhood (bring, raised, lives now, parents, siblings, schooling, education): High school diploma Abuse: Applicable Marital Status: Single Sexual orientation: Male Children: No children Employment: [employer] Peer  Group: Not applicable Housing: Lives with parents and siblings Finances: Works at [employer]: Denies Nature conservation officer: Attended [employer] school prior to high school  Associated Signs/Symptoms:  Depression Symptoms:  insomnia, disturbed sleep, weight loss, decreased appetite, (Hypo) Manic Symptoms:  Elevated Mood, Flight of Ideas,  Anxiety Symptoms:  Specific Phobias,  Psychotic Symptoms:   N/A  PTSD Symptoms: NA  Total Time spent with patient: 1 hour  Past Psychiatric History: MDD  Is the patient at risk to self? No.  Has the patient been a risk to self in the past 6 months? Yes.    Has the patient been a risk to self within the distant past? No.  Is the patient a risk to others? No.  Has the patient been a risk to others in the past 6 months? No.  Has the patient been a risk to others within the distant past? No.   [place] Scale:  [place] Admission (Current) from 11/08/2022 in [facility]  C-SSRS RISK CATEGORY No Risk        Prior Inpatient Therapy: No. If yes, describe N/A Prior Outpatient Therapy: No. If yes, describe N/A   Alcohol Screening: Patient refused Alcohol Screening Tool: Yes 1. How often do you have a drink containing alcohol?: Never 2. How many drinks containing alcohol do you have on a typical day when you are drinking?: 1 or 2 3. How often do you have six or more drinks on one occasion?: Never AUDIT-C Score: 0 4. How often during the last year have you found that you were not able to stop drinking once you had started?: Never 5. How often during the last year have you failed to do what was normally expected from you because of drinking?: Never 6. How often during the last year have you needed a first drink in the morning to get yourself  going after a heavy drinking session?: Never 7. How often during the last year have you had a feeling of guilt of remorse after drinking?: Never 8. How often during the last  year have you been unable to remember what happened the night before because you had been drinking?: Never 9. Have you or someone else been injured as a result of your drinking?: No 10. Has a relative or friend or a doctor or another health worker been concerned about your drinking or suggested you cut down?: No Alcohol Use Disorder Identification Test Final Score (AUDIT): 0 Alcohol Brief Interventions/Follow-up: Patient Refused  Substance Abuse History in the last 12 months:  Yes.    Consequences of Substance Abuse: Medical Consequences:  Intubated  Previous Psychotropic Medications: Yes   Psychological Evaluations: Yes   Past Medical History: History reviewed. No pertinent past medical history. History reviewed. No pertinent surgical history.  Family History: History reviewed. No pertinent family history.  Family Psychiatric  History: Patient states mom has history of depression  Tobacco Screening:  Social History   Tobacco Use  Smoking Status Never  Smokeless Tobacco Former    [facility] Tobacco Counseling     Are you interested in Tobacco Cessation Medications?  No value filed. Counseled patient on smoking cessation:  No value filed. Reason Tobacco Screening Not Completed: No value filed.       Social History:  Social History   Substance and Sexual Activity  Alcohol Use None     Social History   Substance and Sexual Activity  Drug Use Not on file    Additional Social History:        Allergies:  No Known Allergies Lab Results: No results found for this or any previous visit (from the past 48 hour(s)).  Blood Alcohol level:  Lab Results  Component Value Date   ETH <10 [id number]    Metabolic Disorder Labs:  No results found for: "HGBA1C", "MPG" No results found for: "PROLACTIN" Lab Results  Component Value Date   TRIG 72 11/06/2022    Current Medications: Current Facility-Administered Medications  Medication Dose Route Frequency Provider Last Rate Last  Admin   acetaminophen (TYLENOL) tablet 650 mg  650 mg Oral Q6H PRN Starkes-Perry, Gayland Curry, FNP       alum & mag hydroxide-simeth (MAALOX/MYLANTA) 200-200-20 MG/5ML suspension 30 mL  30 mL Oral Q4H PRN Suella Broad, FNP   30 mL at 11/09/22 [id number]   risperiDONE (RISPERDAL M-TABS) disintegrating tablet 2 mg  2 mg Oral Q8H PRN Starkes-Perry, Gayland Curry, FNP       And   LORazepam (ATIVAN) tablet 1 mg  1 mg Oral PRN Starkes-Perry, Gayland Curry, FNP       And   ziprasidone (GEODON) injection 20 mg  20 mg Intramuscular PRN Starkes-Perry, Gayland Curry, FNP       magnesium hydroxide (MILK OF MAGNESIA) suspension 30 mL  30 mL Oral Daily PRN Starkes-Perry, Gayland Curry, FNP       traZODone (DESYREL) tablet 50 mg  50 mg Oral QHS PRN Bobbitt, Shalon E, NP       PTA Medications: Medications Prior to Admission  Medication Sig Dispense Refill Last Dose   docusate sodium (COLACE) 100 MG capsule Take 1 capsule (100 mg total) by mouth 2 (two) times daily as needed for mild constipation. 10 capsule 0    nicotine (NICODERM CQ - DOSED IN MG/24 HOURS) 21 mg/24hr patch Place 1 patch (21 mg total) onto the skin daily. [place]  patch 0    polyethylene glycol (MIRALAX / GLYCOLAX) 17 g packet Take 17 g by mouth daily as needed for moderate constipation. 14 each 0     Musculoskeletal: Strength & Muscle Tone: within normal limits Gait & Station: normal Patient leans: N/A  Psychiatric Specialty Exam:  Presentation  General Appearance:  Appropriate for Environment; Casual; Fairly Groomed  Eye Contact: Good  Speech: Clear and Coherent; Normal Rate  Speech Volume: Normal  Handedness: Right  Mood and Affect  Mood: Euthymic  Affect: Congruent  Thought Process  Thought Processes: Linear; Coherent  Duration of Psychotic Symptoms:N/A Past Diagnosis of Schizophrenia or Psychoactive disorder: No data recorded Descriptions of Associations:Intact  Orientation:Full (Time, Place and Person)  Thought  Content:WDL  Hallucinations:Hallucinations: None  Ideas of Reference:None  Suicidal Thoughts:Suicidal Thoughts: No  Homicidal Thoughts:Homicidal Thoughts: No   Sensorium  Memory: Immediate Fair  Judgment: Fair  Insight: Fair  Executive Functions  Concentration: Good  Attention Span: Good  Recall: Fair  Fund of Knowledge: Fair  Language: Good  Psychomotor Activity  Psychomotor Activity: Psychomotor Activity: Normal  Assets  Assets: Physical Health; Communication Skills; Desire for Improvement; Housing  Sleep  Sleep: Sleep: Good Number of Hours of Sleep: 5  Physical Exam: Physical Exam Vitals and nursing note reviewed.  Constitutional:      Appearance: Normal appearance.  HENT:     Head: Normocephalic.     Right Ear: External ear normal.     Left Ear: External ear normal.     Nose: Nose normal.     Mouth/Throat:     Mouth: Mucous membranes are moist.     Pharynx: Oropharynx is clear.  Eyes:     Extraocular Movements: Extraocular movements intact.     Conjunctiva/sclera: Conjunctivae normal.     Pupils: Pupils are equal, round, and reactive to light.  Cardiovascular:     Rate and Rhythm: Normal rate.     Pulses: Normal pulses.  Pulmonary:     Effort: Pulmonary effort is normal.  Abdominal:     Palpations: Abdomen is soft.  Genitourinary:    Comments: Deferred Musculoskeletal:        General: Normal range of motion.     Cervical back: Normal range of motion.  Skin:    General: Skin is warm.  Neurological:     General: No focal deficit present.     Mental Status: He is alert and oriented to person, place, and time.  Psychiatric:        Mood and Affect: Mood normal.        Behavior: Behavior normal.   Review of Systems  Constitutional: Negative.   HENT: Negative.    Eyes: Negative.   Respiratory: Negative.    Cardiovascular: Negative.   Gastrointestinal: Negative.   Genitourinary: Negative.   Musculoskeletal: Negative.   Skin:  Negative.        Acne to upper and lower back  Neurological: Negative.   Endo/Heme/Allergies: Negative.   Psychiatric/Behavioral:  Positive for depression and substance abuse. The patient is nervous/anxious and has insomnia.    Blood pressure 127/80, pulse 95, temperature 98.2 F (36.8 C), temperature source Oral, resp. rate 18, height 6\' 2"  (1.88 m), weight 64.9 kg, SpO2 98 %. Body mass index is 18.36 kg/m.  Treatment Plan Summary: Daily contact with patient to assess and evaluate symptoms and progress in treatment and Medication management  Observation Level/Precautions:  15 minute checks  Laboratory:  CBC Chemistry Profile HbAIC UDS UA  Psychotherapy: Therapeutic  milieu  Medications: See [facility]  Consultations: Not applicable  Discharge Concerns: Safety  Estimated LOS: 3 to 5 days  Other:     Physician Treatment Plan for Primary Diagnosis: MDD (major depressive disorder), recurrent episode, severe ([facility]) Long Term Goal(s): Improvement in symptoms so as ready for discharge  Short Term Goals: Ability to identify changes in lifestyle to reduce recurrence of condition will improve, Ability to verbalize feelings will improve, Ability to disclose and discuss suicidal ideas, Ability to demonstrate self-control will improve, Ability to identify and develop effective coping behaviors will improve, Ability to maintain clinical measurements within normal limits will improve, Compliance with prescribed medications will improve, and Ability to identify triggers associated with substance abuse/mental health issues will improve  Treatment Plan Summary: Daily contact with patient to assess and evaluate symptoms and progress in treatment and Medication management  Plan: Safety and Monitoring: Voluntary admission to inpatient psychiatric unit for safety, stabilization and treatment Daily contact with patient to assess and evaluate symptoms and progress in treatment Patient's case to be discussed in  multi-disciplinary team meeting Observation Level : q15 minute checks Vital signs: q12 hours Precautions: suicide, but pt currently verbally contracts for safety on unit   Major depressive disorder recurrent severe -- Initiate mirtazapine (Remeron) 15 mg disintegrating tabs p.o. q. Nightly.  Anxiety -Initiate hydroxyzine 25 mg 3 times daily as needed/anxiety   Insomnia -Initiate trazodone 50 mg p.o. q. nightly as needed  Constipation --Initiate Colace 100 mg capsule 1 p.o. twice daily as needed for mild constipation (Home medication). -- Polyethylene glycol MiraLAX/GlycoLax 17 g by mouth p.o. as needed for moderate constipation.  Smoking cessation Nicotine NicoDerm CQ -dose in milligrams per 24 hours 21 mg per 24 hours patch 1 patch onto skin daily  Agitation protocol Respite all disintegrating tabs 2 mg p.o. every 8 hours as needed agitation and Lorazepam tablet 1 mg p.o. as needed severe agitation x 1 dose only and Ziprasidone Geodon injection 20 mg IM as needed agitation x 1 dose only  Other PRN Medications -Acetaminophen 650 mg every 6 as needed/mild pain -Maalox 30 mL oral every 4 as needed/digestion -Magnesium hydroxide 30 mL daily as needed/mild constipation   Discharge Planning: Social work and case management to assist with discharge planning and identification of hospital follow-up needs prior to discharge Estimated LOS: 5-7 days Discharge Concerns: Need to establish a safety plan; Medication compliance and effectiveness Discharge Goals: Return home with outpatient referrals for mental health follow-up including medication management/psychotherapy.  I certify that inpatient services furnished can reasonably be expected to improve the patient's condition.    Cecilie Lowers, FNP 12/23/202311:40 AM

## 2022-11-09 NOTE — Progress Notes (Signed)
Adult Psychoeducational Group Note  Date:  11/09/2022 Time:  10:22 PM  Group Topic/Focus:  Wrap-Up Group:   The focus of this group is to help patients review their daily goal of treatment and discuss progress on daily workbooks.  Participation Level:  Active  Participation Quality:  Appropriate  Affect:  Appropriate  Cognitive:  Appropriate  Insight: Appropriate  Engagement in Group:  Developing/Improving  Modes of Intervention:  Discussion  Additional Comments:  Pt stated his goal for today was to focus on his treatment plan and discuss his charge plan with his doctor. Pt stated he accomplished his goals today. Pt stated he talked with his doctor and social worker about his care today. Pt rated his overall day a 10. Pt stated he was able to contact his fiance today which improved his overall day. Pt stated he felt better about himself today. Pt stated he was able to attend all meals. Pt stated he took all medications provided today. Pt stated he attend all groups held today. Pt stated his appetite was pretty good today. Pt rated sleep last night was fair. Pt stated the goal tonight was to get some rest. Pt stated he had no physical pain tonight. Pt deny visual hallucinations and auditory issues tonight. Pt denies thoughts of harming himself or others. Pt stated he would alert staff if anything changed  Felipa Furnace 11/09/2022, 10:22 PM

## 2022-11-09 NOTE — Progress Notes (Signed)
Reports he vomited x1 after dinner, this writer did not see the vomit, Natasha Bence NP  notified and new order for Zofran given. Zofran given to patient. Pt is resting in the dayroom during visitation at this time.

## 2022-11-09 NOTE — Group Note (Signed)
LCSW Group Therapy Note  11/09/2022      Type of Therapy and Topic:  Group Therapy: Gratitude   Description:   Group could not be held by social worker, but licensed RN did provide group.  A handout was given to each patient, with the following information:   Gratitude  "Acknowledging the good that you already have in your life is the foundation for all abundance." - Eckhart Tolle  " 'Enough' is a feast." - Buddhist Proverb  "Gratitude sweetens even the smallest moments."  "It is not joy that makes us grateful; It is gratitude that makes us joyful." - David Steindl-Rast    Put at least one response under each category of something for which you are grateful:  People:  Experiences:  Things:  Places:  Skills:  Other:  Add more responses as you get ideas from other people.   Therapeutic Modalities:   Activity  Elecia Serafin J Grossman-Orr, LCSW   

## 2022-11-09 NOTE — Progress Notes (Signed)
Admission Note:  patient is a 61 VOL male from [facility]. Pt was calm and cooperative during assessment. Denies SI/HI/AVH. Admission plan of care reviewed with pt, consent signed.  Personal belongings/skin assessment completed.   No contraband found.  Patient oriented to the unit, staff and room.  Routine safety checks initiated.  Verbalizes understanding of unit rules/protocols.   Patient is presently safe on the unit. No unsafe behaviors noted.  Q 15 minute safety checks maintained per unit protocol.

## 2022-11-09 NOTE — Tx Team (Signed)
Initial Treatment Plan 11/09/2022 3:12 AM Stephen Underwood [id number]    PATIENT STRESSORS: Substance abuse     PATIENT STRENGTHS: Communication skills  Supportive family/friends    PATIENT IDENTIFIED PROBLEMS: Substance Abuse  ADHD                   DISCHARGE CRITERIA:  Medical problems require only outpatient monitoring  PRELIMINARY DISCHARGE PLAN: Return to previous living arrangement  PATIENT/FAMILY INVOLVEMENT: This treatment plan has been presented to and reviewed with the patient, Stephen Underwood, and/or family member, .  The patient and family have been given the opportunity to ask questions and make suggestions.  Jarome Matin, RN 11/09/2022, 3:12 AM

## 2022-11-09 NOTE — [facility] Group Notes (Signed)
Adult Psychoeducational Group Note Date:  11/09/2022 Time:  0900-1000 Group Topic/Focus: PROGRESSIVE RELAXATION. Underwood group where deep breathing is taught and tensing and relaxation muscle groups is used. Imagery is used as well.  Pts are asked to imagine 3 pillars that hold them up when they are not able to hold themselves up and to share that with the group.   Participation Level: did not attend   : Stephen Underwood  

## 2022-11-09 NOTE — Plan of Care (Signed)
  Problem: Education: Goal: Knowledge of [place] [facility] Education information/materials will improve Outcome: Progressing Goal: Emotional status will improve Outcome: Progressing Goal: Mental status will improve Outcome: Progressing Goal: Verbalization of understanding the information provided will improve Outcome: Progressing   Problem: Activity: Goal: Interest or engagement in activities will improve Outcome: Progressing Goal: Sleeping patterns will improve Outcome: Progressing   

## 2022-11-09 NOTE — Progress Notes (Signed)
Patient cooperative and pleasant on the unit today. Denies SI/HI/AVH. Denies pain at present.

## 2022-11-09 NOTE — [facility] Suicide Risk Assessment (Signed)
Suicide Risk Assessment  Admission Assessment    [facility] Admission Suicide Risk Assessment   Nursing information obtained from:  Patient Demographic factors:  Male Current Mental Status:  NA Loss Factors:  NA Historical Factors:  Impulsivity Risk Reduction Factors:  Positive therapeutic relationship, Positive social support, Employed, Sense of responsibility to family  Total Time spent with patient: 1 hour Principal Problem: MDD (major depressive disorder), recurrent episode, severe (HCC) Diagnosis:  Principal Problem:   MDD (major depressive disorder), recurrent episode, severe (HCC)  Subjective Data:  Stephen Underwood is a 21 year old male who presents voluntarily to [facility] from [facility] for recent suicide attempt by overdose on Xanax, Percocet, ecstasy, and cocaine.  Patient states that he attended a party and had too much fun when he overdosed on the illegal drugs.  Reports that this is his first attempt with these types of drugs however, it was not to commit suicide.  Reports using marijuana previously as his drug of choice however, not to harm himself.  He reports that his fiance dropped him off at the emergency room on 11/05/2022, because he was unresponsive from these drugs effect.  Patient has psychiatric diagnoses significant for major depressive disorder recurrent episode severe, and ADHD.  Also past medical history for chronic respiratory failure with hypoxia and status epilepticus.  He lives at home with his mother, dad and siblings.  Based on clinical factors below patient is admitted to [facility] for mood stabilization, medication management and safety.       Continued Clinical Symptoms:  Alcohol Use Disorder Identification Test Final Score (AUDIT): 0 The "Alcohol Use Disorders Identification Test", Guidelines for Use in Primary Care, Second Edition.  World Science writer [facility]). Score between 0-7:  no or low  risk or alcohol related problems. Score between 8-15:  moderate risk of alcohol related problems. Score between 16-19:  high risk of alcohol related problems. Score 20 or above:  warrants further diagnostic evaluation for alcohol dependence and treatment.  CLINICAL FACTORS:   Depression:   Impulsivity More than one psychiatric diagnosis  Musculoskeletal: Strength & Muscle Tone: within normal limits Gait & Station: normal Patient leans: N/A  Psychiatric Specialty Exam:  Presentation  General Appearance:  Appropriate for Environment; Casual; Fairly Groomed  Eye Contact: Good  Speech: Clear and Coherent; Normal Rate  Speech Volume: Normal  Handedness: Right  Mood and Affect  Mood: Euthymic  Affect: Congruent  Thought Process  Thought Processes: Linear; Coherent  Descriptions of Associations:Intact  Orientation:Full (Time, Place and Person)  Thought Content:WDL  History of Schizophrenia/Schizoaffective disorder:No data recorded Duration of Psychotic Symptoms:No data recorded Hallucinations:Hallucinations: None  Ideas of Reference:None  Suicidal Thoughts:Suicidal Thoughts: No  Homicidal Thoughts:Homicidal Thoughts: No  Sensorium  Memory: Immediate Fair  Judgment: Fair  Insight: Fair  Executive Functions  Concentration: Good  Attention Span: Good  Recall: Fair  Fund of Knowledge: Fair  Language: Good  Psychomotor Activity  Psychomotor Activity: Psychomotor Activity: Normal  Assets  Assets: Physical Health; Communication Skills; Desire for Improvement; Housing  Sleep  Sleep: Sleep: Good Number of Hours of Sleep: 5  Physical Exam: Physical Exam Vitals and nursing note reviewed.  Constitutional:      Appearance: Normal appearance.  HENT:     Head: Normocephalic.     Right Ear: External ear normal.     Left Ear: External ear normal.     Nose: Nose normal.     Mouth/Throat:  Mouth: Mucous membranes are moist.      Pharynx: Oropharynx is clear.  Eyes:     Extraocular Movements: Extraocular movements intact.     Conjunctiva/sclera: Conjunctivae normal.     Pupils: Pupils are equal, round, and reactive to light.  Cardiovascular:     Rate and Rhythm: Normal rate.     Pulses: Normal pulses.  Pulmonary:     Effort: Pulmonary effort is normal.  Abdominal:     Palpations: Abdomen is soft.  Genitourinary:    Comments: Deferred Musculoskeletal:        General: Normal range of motion.     Cervical back: Normal range of motion.  Skin:    General: Skin is warm.  Neurological:     General: No focal deficit present.     Mental Status: He is alert and oriented to person, place, and time.  Psychiatric:        Behavior: Behavior normal.    Review of Systems  Constitutional: Negative.   HENT: Negative.    Eyes: Negative.   Respiratory: Negative.    Cardiovascular: Negative.   Gastrointestinal: Negative.   Genitourinary: Negative.   Musculoskeletal: Negative.   Skin: Negative.        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  Endo/Heme/Allergies: Negative.   Psychiatric/Behavioral:  Positive for substance abuse. The patient is nervous/anxious.    Blood pressure 122/74, pulse 89, temperature 97.9 F (36.6 C), temperature source Oral, resp. rate 18, height 6\' 2"  (1.88 m), weight 64.9 kg, SpO2 98 %. Body mass index is 18.36 kg/m.   COGNITIVE FEATURES THAT CONTRIBUTE TO RISK:  Polarized thinking    SUICIDE RISK:   Severe:  Frequent, intense, and enduring suicidal ideation, specific plan, no subjective intent, but some objective markers of intent (i.e., choice of lethal method), the method is accessible, some limited preparatory behavior, evidence of impaired self-control, severe dysphoria/symptomatology, multiple risk factors present, and few if any protective factors, particularly a lack of social support.  PLAN OF CARE:  Treatment Plan Summary: Daily  contact with patient to assess and evaluate symptoms and progress in treatment and Medication management   Observation Level/Precautions:  15 minute checks  Laboratory:  CBC Chemistry Profile HbAIC UDS UA  Psychotherapy: Therapeutic milieu  Medications: See MAR  Consultations: Not applicable  Discharge Concerns: Safety  Estimated LOS: 3 to 5 days  Other:      Physician Treatment Plan for Primary Diagnosis: MDD (major depressive disorder), recurrent episode, severe (HCC) Long Term Goal(s): Improvement in symptoms so as ready for discharge   Short Term Goals: Ability to identify changes in lifestyle to reduce recurrence of condition will improve, Ability to verbalize feelings will improve, Ability to disclose and discuss suicidal ideas, Ability to demonstrate self-control will improve, Ability to identify and develop effective coping behaviors will improve, Ability to maintain clinical measurements within normal limits will improve, Compliance with prescribed medications will improve, and Ability to identify triggers associated with substance abuse/mental health issues will improve   Treatment Plan Summary: Daily contact with patient to assess and evaluate symptoms and progress in treatment and Medication management   Plan: Safety and Monitoring: Voluntary admission to inpatient psychiatric unit for safety, stabilization and treatment Daily contact with patient to assess and evaluate symptoms and progress in treatment Patient's case to be discussed in multi-disciplinary team meeting Observation Level : q15 minute checks Vital signs: q12 hours Precautions: suicide, but pt currently verbally contracts for safety  on unit  Depressive disorder with current severe --Initiate mirtazapine (Remeron) 15 mg disintegrating tabs p.o. q. nightly  Anxiety -Initiate hydroxyzine 25 mg 3 times daily as needed/anxiety   Insomnia -Initiate trazodone 50 mg p.o. q. nightly as needed    Constipation --Initiate Colace 100 mg capsule 1 p.o. twice daily as needed for mild constipation (Home medication). -- Polyethylene glycol MiraLAX/GlycoLax 17 g by mouth p.o. as needed for moderate constipation.   Smoking cessation Nicotine NicoDerm CQ -dose in milligrams per 24 hours 21 mg per 24 hours patch 1 patch onto skin daily   Agitation protocol Respite all disintegrating tabs 2 mg p.o. every 8 hours as needed agitation and Lorazepam tablet 1 mg p.o. as needed severe agitation x 1 dose only and Ziprasidone Geodon injection 20 mg IM as needed agitation x 1 dose only   Other PRN Medications -Acetaminophen 650 mg every 6 as needed/mild pain -Maalox 30 mL oral every 4 as needed/digestion -Magnesium hydroxide 30 mL daily as needed/mild constipation   Discharge Planning: Social work and case management to assist with discharge planning and identification of hospital follow-up needs prior to discharge Estimated LOS: 5-7 days Discharge Concerns: Need to establish a safety plan; Medication compliance and effectiveness Discharge Goals: Return home with outpatient referrals for mental health follow-up including medication management/psychotherapy.    I certify that inpatient services furnished can reasonably be expected to improve the patient's condition.   Cecilie Lowers, FNP 11/09/2022, 4:54 PM

## 2022-11-10 DIAGNOSIS — F332 Major depressive disorder, recurrent severe without psychotic features: Secondary | ICD-10-CM | POA: Diagnosis not present

## 2022-11-10 LAB — SARS CORONAVIRUS 2 BY RT PCR: SARS Coronavirus 2 by RT PCR: NEGATIVE

## 2022-11-10 NOTE — [facility] Suicide Risk Assessment (Signed)
[facility] INPATIENT:  Family/Significant Other Suicide Prevention Education  Suicide Prevention Education:  Contact Attempts: mother Stephen Underwood [phone], (name of family member/significant other) has been identified by the patient as the family member/significant other with whom the patient will be residing, and identified as the person(s) who will aid the patient in the event of a mental health crisis.  With written consent from the patient, two attempts were made to provide suicide prevention education, prior to and/or following the patient's discharge.  We were unsuccessful in providing suicide prevention education.  A suicide education pamphlet was given to the patient to share with family/significant other.  Date and time of first attempt:   12/24 at 5:57pm (HIPAA-compliant voicemail left) Date and time of second attempt:  CSW team to continue contact attempts  Stephen Underwood 11/10/2022, 5:59 PM

## 2022-11-10 NOTE — Progress Notes (Signed)
Adult Psychoeducational Group Note  Date:  11/10/2022 Time:  9:38 PM  Group Topic/Focus:  Wrap-Up Group:   The focus of this group is to help patients review their daily goal of treatment and discuss progress on daily workbooks.  Participation Level:  Active  Participation Quality:  Appropriate  Affect:  Appropriate  Cognitive:  Appropriate  Insight: Appropriate  Engagement in Group:  Engaged  Modes of Intervention:  Discussion, Rapport Building, and Support  Additional Comments:   Pt attended and participated in the Wrap Up group. Pt denied SI/HI/AVH. Pt rated his day 8/10. Pt spoke with the provider and was able to make some toward  his goal of discharge planing. Pt identified  attending the goals, talking more about what his experiences and challenges and being more active as effective coping skills. Pt receptive to verbal praise.  Edmund Hilda Aryahi Denzler 11/10/2022, 9:38 PM

## 2022-11-10 NOTE — [facility] Counselor (Signed)
Adult Comprehensive Assessment  Patient ID: Stephen Underwood, male   DOB: February 03, 2001, 21 y.o.   MRN: [id number]  Information Source: Information source: Patient  Current Stressors:  Patient states their primary concerns and needs for treatment are:: "I took drugs at a party, a whole lot, then apparently lost my pulse.  When I came out of it, they said I said I wanted to kill myself.  That was just a misinterpretation." Patient states their goals for this hospitilization and ongoing recovery are:: "Talk to someone.  This was just a one-time thing." Educational / Learning stressors: Denies stressors Employment / Job issues: Denies stressors Family Relationships: Denies Chief Technology Officer / Lack of resources (include bankruptcy): Denies stressors Housing / Lack of housing: Denies stressors Physical health (include injuries & life threatening diseases): Sometimes does not have an appetite Social relationships: Denies stressors Substance abuse: His recent drug use at a party almost cost him his life.  "I'm not going to ever do that again." Bereavement / Loss: Denies stressors  Living/Environment/Situation:  Living Arrangements: Parent, Other relatives Living conditions (as described by patient or guardian): Good Who else lives in the home?: mother, stepfather, sister How long has patient lived in current situation?: Whole life What is atmosphere in current home: Comfortable, Paramedic, Supportive  Family History:  Marital status: Long term relationship Long term relationship, how long?: 8 months - is engaged What types of issues is patient dealing with in the relationship?: "none" Does patient have children?: No  Childhood History:  By whom was/is the patient raised?: Mother/father and step-parent Additional childhood history information: Biological father - no contact.  Stepfather came into his life when he was approximately 1-1/22yo. Description of patient's relationship with caregiver  when they were a child: Mother - always supportive; Stepfather - always supportive Patient's description of current relationship with people who raised him/her: Mother - same; Stepfather - same How were you disciplined when you got in trouble as a child/adolescent?: Standard ways Does patient have siblings?: Yes Number of Siblings: 1 Description of patient's current relationship with siblings: sister - "perfect relationship" Did patient suffer from severe childhood neglect?: No Has patient ever been sexually abused/assaulted/raped as an adolescent or adult?: No Was the patient ever a victim of a crime or a disaster?: No Witnessed domestic violence?: No Has patient been affected by domestic violence as an adult?: No  Education:  Highest grade of school patient has completed: 10th grade Currently a student?: No Learning disability?: No  Employment/Work Situation:   Employment Situation: Employed 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?: No  Financial Resources:   Financial resources: Income from employment, Private insurance Does patient have a representative payee or guardian?: No  Alcohol/Substance Abuse:   What has been your use of drugs/alcohol within the last 12 months?: Rare social drinking; marijuana twice a week; the night of this incident he used a variety of drugs, does not even know what he used. If attempted  suicide, did drugs/alcohol play a  role in this?: Yes Alcohol/Substance Abuse Treatment Hx: Denies past history Has alcohol/substance abuse ever caused legal problems?: No  Social Support System:   Conservation officer, nature Support System: Production assistant, radio System: mother, stepfather, sister, fiancee Type of faith/religion: None How does patient's faith help to cope with current illness?: N/A  Leisure/Recreation:   Do You Have Hobbies?: Yes Leisure and Hobbies: basketball, video games, dog training  Strengths/Needs:   What is the patient's perception of their strengths?: Self-control, consistent support Patient states they can use these personal strengths during their treatment to contribute to their recovery: Do not do that again, do not want to put support system through that again. Patient states these barriers may affect/interfere with their treatment: N/A Patient states these barriers may affect their return to the community: N/A Other important information patient would like considered in planning for their treatment: N/A  Discharge Plan:   Currently receiving community mental health services: No Patient states concerns and preferences for aftercare planning are: Would like a referral to a GED program and a referral to a doctor for his medicines.  Does not feel he needs therapy. Patient states they will know when they are safe and ready for discharge when: "My support system is there, and I can go to them with anything." Does patient have access to transportation?: Yes Does patient have financial barriers related to discharge medications?: No Will patient be returning to same living situation after discharge?: Yes  Summary/Recommendations:   Summary and Recommendations (to be completed by the evaluator): Patient is a 21yo male who is hospitalized after becoming unconscious and having a life-threatening response to drug overdose.  While he was reported to tell his care providers at extubation that this was a  suicide attempt, he is now insistent that he had no suicidal intent.  He reports taking a variety of medicines including benzodiazepines, opiates, in addition to ecstasy and cocaine.  He states that he is engaged, although an earlier report states that they had broken up.  He lives with his mother, stepfather who has been in his life since he was 1-1/2yo, and sister.  His biological father is incarcerated and they do not have a relationship.  Mother reports that he had behavioral issues throughout elementary, middle, and high school.  He was enrolled in [employer] school in high school, but dropped out of high school and now wants a referral to start a GED program.  The patient would benefit from crisis stabilization, milieu participation, medication evaluation and management, group therapy, psychoeducation, safety monitoring, and discharge planning.  At discharge it is recommended that the patient adhere to the established aftercare plan.  Lynnell Chad. 11/10/2022

## 2022-11-10 NOTE — Group Note (Unsigned)
Date:  11/10/2022 Time:  2:03 PM  Group Topic/Focus:  Goals Group:   The focus of this group is to help patients establish daily goals to achieve during treatment and discuss how the patient can incorporate goal setting into their daily lives to aide in recovery. Orientation:   The focus of this group is to educate the patient on the purpose and policies of crisis stabilization and provide a format to answer questions about their admission.  The group details unit policies and expectations of patients while admitted.     Participation Level:  {[facility] PARTICIPATION LEVEL:22264}  Participation Quality:  {[facility] PARTICIPATION QUALITY:22265}  Affect:  {[facility] AFFECT:22266}  Cognitive:  {[facility] COGNITIVE:22267}  Insight: {[facility] Insight2:20797}  Engagement in Group:  {[facility] ENGAGEMENT IN GROUP:22268}  Modes of Intervention:  {[facility] MODES OF INTERVENTION:22269}  Additional Comments:  ***  Oaklan Persons Lashawn Mykai Wendorf 11/10/2022, 2:03 PM  

## 2022-11-10 NOTE — Group Note (Signed)
Date:  11/10/2022 Time:  2:01 PM  Group Topic/Focus:  Goals Group:   The focus of this group is to help patients establish daily goals to achieve during treatment and discuss how the patient can incorporate goal setting into their daily lives to aide in recovery.    Participation Level:  Active  Participation Quality:  Appropriate  Affect:  Appropriate  Cognitive:  Appropriate  Insight: Appropriate  Engagement in Group:  Engaged  Modes of Intervention:  Discussion  Additional Comments:  Pt wants to talk to Dr and SW  Jaquita Rector 11/10/2022, 2:01 PM

## 2022-11-10 NOTE — Progress Notes (Signed)
   11/10/22 1000  Psych Admission Type (Psych Patients Only)  Admission Status Voluntary  Psychosocial Assessment  Patient Complaints Anxiety  Eye Contact Brief  Facial Expression Animated  Affect Appropriate to circumstance  Speech Logical/coherent  Interaction Assertive  Motor Activity Slow  Appearance/Hygiene Unremarkable  Behavior Characteristics Cooperative  Mood Pleasant  Thought Process  Coherency WDL  Content WDL  Delusions None reported or observed  Perception WDL  Hallucination None reported or observed  Judgment WDL  Confusion WDL  Danger to Self  Current suicidal ideation? Denies  Danger to Others  Danger to Others None reported or observed

## 2022-11-10 NOTE — Progress Notes (Signed)
   11/09/22 2100  Psych Admission Type (Psych Patients Only)  Admission Status Voluntary  Psychosocial Assessment  Patient Complaints Anxiety  Eye Contact Fair  Facial Expression Animated  Affect Appropriate to circumstance  Speech Logical/coherent  Interaction Assertive  Motor Activity Slow  Appearance/Hygiene Unremarkable  Behavior Characteristics Cooperative  Mood Pleasant  Thought Process  Coherency WDL  Content WDL  Delusions None reported or observed  Perception WDL  Hallucination None reported or observed  Judgment WDL  Confusion WDL  Danger to Self  Current suicidal ideation? Denies  Danger to Others  Danger to Others None reported or observed

## 2022-11-10 NOTE — Progress Notes (Signed)
[facility] MD Progress Note  11/10/2022 11:14 AM Stephen Underwood  MRN:  [id number]  Subjective: Stephen Underwood states, "I slept very good last night, my mood is great.  I am still reaffirming that I did not want to kill myself."  Seen for admission: Stephen Underwood is a 21 year old male who presents voluntarily to [facility] from [facility] for recent suicide attempt by overdose on Xanax, Percocet, ecstasy, and cocaine.  Patient states that he attended a party and had too much fun when he overdosed on the illegal drugs.  Reports that this is his first attempt with these types of drugs however, it was not to commit suicide.  Reports using marijuana previously as his drug of choice however, not to harm himself.  He reports that his fiance dropped him off at the emergency room on 11/05/2022, because he was unresponsive from these drugs effect.  Patient has psychiatric diagnoses significant for major depressive disorder recurrent episode severe, and ADHD.  Also past medical history for chronic respiratory failure with hypoxia and status epilepticus.  He lives at home with his mother, dad and siblings.  24 hr chart review: V/S for the past 24 hrs have been WNL. Pt has been compliant with all scheduled medications. PRN Zofran for nausea, and trazodone was administered for sleep we administered past night. Maalox for mild indigestion was administered at this morning.  Pt appears happy and associating with other patients in the unit.  Today's assessment: On assessment today patient mood is less depressed. He appears pleasant during assessment today. He denies suicidal ideations.  He denies AVH, denies paranoia and denies delusional thinking. His attention to personal hygiene and grooming is good, eye contact is good, speech is clear & coherent. Thought contents are organized and logical.   Pt reports that he slept good last night, and feeling restful.  He reports  good appetite,  denies being in any physical pain. He denies any medication related side effects.  No changes to his scheduled medication or prn at this time.  Patient tolerating medication with great outcomes in mood.  Vital signs and lab reviewed, with no critical values.   Principal Problem: MDD (major depressive disorder), recurrent episode, severe (HCC)  Diagnosis: Principal Problem:   MDD (major depressive disorder), recurrent episode, severe (HCC)  Total Time spent with patient: 45 minutes  Past Psychiatric History: Previous Psych Diagnoses: Major depressive disorder without psychotic features. Prior inpatient treatment: Not applicable Current/prior outpatient treatment: Not applicable Prior rehab hx: Not applicable Psychotherapy hx: Yes as a teenager History of suicide: Patient denies History of homicide or aggression: Patient denies Psychiatric medication history: Patient treated with Vyvanse in the past for ADD Psychiatric medication compliance history: Yes Neuromodulation history: Not applicable Current Psychiatrist: Not applicable Current therapist: Not applicable    Past Medical History: History reviewed. No pertinent past medical history. History reviewed. No pertinent surgical history.  Family History: History reviewed. No pertinent family history.  Family Psychiatric  History: Medical: Mother diagnosed with anxiety and depression Psych: Mother diagnosed with anxiety and depression Psych Rx: Patient does not remember SA/HA: Not applicable Substance use family hx: Not applicable    Social History:  Social History   Substance and Sexual Activity  Alcohol Use None     Social History   Substance and Sexual Activity  Drug Use Not on file    Social History   Socioeconomic History   Marital status: Single    Spouse name: Not  on file   Number of children: Not on file   Years of education: Not on file   Highest education level: Not on file  Occupational History   Not on  file  Tobacco Use   Smoking status: Never   Smokeless tobacco: Former  Building services engineer Use: Every day  Substance and Sexual Activity   Alcohol use: Not on file   Drug use: Not on file   Sexual activity: Not on file  Other Topics Concern   Not on file  Social History Narrative   Not on file   Social Determinants of Health   Financial Resource Strain: Not on file  Food Insecurity: No Food Insecurity (11/09/2022)   Hunger Vital Sign    Worried About Running Out of Food in the Last Year: Never true    Ran Out of Food in the Last Year: Never true  Transportation Needs: No Transportation Needs (11/09/2022)   PRAPARE - Administrator, Civil Service (Medical): No    Lack of Transportation (Non-Medical): No  Physical Activity: Not on file  Stress: Not on file  Social Connections: Not on file   Additional Social History:    Sleep: Good  Appetite:  Good  Current Medications: Current Facility-Administered Medications  Medication Dose Route Frequency Provider Last Rate Last Admin   acetaminophen (TYLENOL) tablet 650 mg  650 mg Oral Q6H PRN Starkes-Perry, Juel Burrow, FNP       alum & mag hydroxide-simeth (MAALOX/MYLANTA) 200-200-20 MG/5ML suspension 30 mL  30 mL Oral Q4H PRN Maryagnes Amos, FNP   30 mL at 11/09/22 0454   risperiDONE (RISPERDAL M-TABS) disintegrating tablet 2 mg  2 mg Oral Q8H PRN Starkes-Perry, Juel Burrow, FNP       And   LORazepam (ATIVAN) tablet 1 mg  1 mg Oral PRN Starkes-Perry, Juel Burrow, FNP       And   ziprasidone (GEODON) injection 20 mg  20 mg Intramuscular PRN Starkes-Perry, Juel Burrow, FNP       magnesium hydroxide (MILK OF MAGNESIA) suspension 30 mL  30 mL Oral Daily PRN Starkes-Perry, Juel Burrow, FNP       mirtazapine (REMERON SOL-TAB) disintegrating tablet 15 mg  15 mg Oral QHS Karol Liendo C, FNP   15 mg at 11/09/22 2053   ondansetron (ZOFRAN) tablet 4 mg  4 mg Oral Q8H PRN Leevy-Johnson, Brooke A, NP   4 mg at 11/09/22 1831   traZODone  (DESYREL) tablet 50 mg  50 mg Oral QHS PRN Bobbitt, Shalon E, NP   50 mg at 11/09/22 2053    Lab Results:  Results for orders placed or performed during the hospital encounter of 11/08/22 (from the past 48 hour(s))  SARS Coronavirus 2 by RT PCR (hospital order, performed in [facility] hospital lab) *cepheid single result test* Anterior Nasal Swab     Status: None   Collection Time: 11/10/22  5:40 AM   Specimen: Anterior Nasal Swab  Result Value Ref Range   SARS Coronavirus 2 by RT PCR NEGATIVE NEGATIVE    Comment: (NOTE) SARS-CoV-2 target nucleic acids are NOT DETECTED.  The SARS-CoV-2 RNA is generally detectable in upper and lower respiratory specimens during the acute phase of infection. The lowest concentration of SARS-CoV-2 viral copies this assay can detect is 250 copies / mL. A negative result does not preclude SARS-CoV-2 infection and should not be used as the sole basis for treatment or other patient management decisions.  A  negative result may occur with improper specimen collection / handling, submission of specimen other than nasopharyngeal swab, presence of viral mutation(s) within the areas targeted by this assay, and inadequate number of viral copies (<250 copies / mL). A negative result must be combined with clinical observations, patient history, and epidemiological information.  Fact Sheet for Patients:   RoadLapTop.co.za  Fact Sheet for Healthcare Providers: http://kim-miller.com/  This test is not yet approved or  cleared by the [place] FDA and has been authorized for detection and/or diagnosis of SARS-CoV-2 by FDA under an Emergency Use Authorization (EUA).  This EUA will remain in effect (meaning this test can be used) for the duration of the COVID-19 declaration under Section 564(b)(1) of the Act, 21 U.S.C. section 360bbb-3(b)(1), unless the authorization is terminated or revoked sooner.  Performed at  [facility], [address] [place], [place] [address]     Blood Alcohol level:  Lab Results  Component Value Date   ETH <10 11/05/2022    Metabolic Disorder Labs: No results found for: "HGBA1C", "MPG" No results found for: "PROLACTIN" Lab Results  Component Value Date   TRIG 72 11/06/2022   Physical Findings: AIMS:  , ,  ,  ,    CIWA:    COWS:     Musculoskeletal: Strength & Muscle Tone: within normal limits Gait & Station: normal Patient leans: N/A  Psychiatric Specialty Exam:  Presentation  General Appearance:  Appropriate for Environment; Casual; Fairly Groomed  Eye Contact: Good  Speech: Clear and Coherent; Normal Rate  Speech Volume: Normal  Handedness: Right  Mood and Affect  Mood: Anxious; Depressed  Affect: Appropriate; Congruent  Thought Process  Thought Processes: Coherent  Descriptions of Associations:Intact  Orientation:Full (Time, Place and Person)  Thought Content:Logical  History of Schizophrenia/Schizoaffective disorder:No data recorded Duration of Psychotic Symptoms:No data recorded Hallucinations:Hallucinations: None  Ideas of Reference:None  Suicidal Thoughts:Suicidal Thoughts: No SI Active Intent and/or Plan: -- (Patient denies)  Homicidal Thoughts:Homicidal Thoughts: No  Sensorium  Memory: Immediate Good  Judgment: Fair  Insight: Fair  Executive Functions  Concentration: Good  Attention Span: Good  Recall: Fair  Fund of Knowledge: Fair  Language: Good  Psychomotor Activity  Psychomotor Activity: Psychomotor Activity: Normal  Assets  Assets: Communication Skills; Desire for Improvement; Housing; Physical Health  Sleep  Sleep: Sleep: Good Number of Hours of Sleep: 8  Physical Exam: Physical Exam Vitals and nursing note reviewed.  Constitutional:      Appearance: Normal appearance.  HENT:     Head: Normocephalic.     Nose: Nose normal.     Mouth/Throat:      Mouth: Mucous membranes are moist.     Pharynx: Oropharynx is clear.  Eyes:     Extraocular Movements: Extraocular movements intact.     Conjunctiva/sclera: Conjunctivae normal.     Pupils: Pupils are equal, round, and reactive to light.  Cardiovascular:     Rate and Rhythm: Normal rate.     Pulses: Normal pulses.     Comments: Blood pressure 131/97, pulse 81.  Nursing staff to recheck vital signs. Pulmonary:     Effort: Pulmonary effort is normal.  Abdominal:     Palpations: Abdomen is soft.  Genitourinary:    Comments: Deferred Musculoskeletal:        General: Normal range of motion.     Cervical back: Normal range of motion.  Skin:    General: Skin is warm.  Neurological:     General: No focal  deficit present.     Mental Status: He is alert and oriented to person, place, and time.  Psychiatric:        Mood and Affect: Mood normal.        Behavior: Behavior normal.    Review of Systems  Constitutional: Negative.   HENT: Negative.    Eyes: Negative.   Respiratory: Negative.    Cardiovascular: Negative.        Blood pressure 131/97, pulse 81.  Nursing staff to recheck vital signs.  Gastrointestinal: Negative.   Genitourinary: Negative.   Musculoskeletal: Negative.   Skin: Negative.        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  Endo/Heme/Allergies: Negative.   Psychiatric/Behavioral:  Positive for depression and substance abuse. The patient is nervous/anxious.    Blood pressure (!) 131/97, pulse 81, temperature 97.9 F (36.6 C), temperature source Oral, resp. rate 18, height 6\' 2"  (1.88 m), weight 64.9 kg, SpO2 100 %. Body mass index is 18.36 kg/m.   Treatment Plan Summary: Daily contact with patient to assess and evaluate symptoms and progress in treatment and Medication management  Observation Level/Precautions:  15 minute checks  Laboratory:  CBC Chemistry Profile HbAIC UDS UA  Psychotherapy: Therapeutic  milieu  Medications: See MAR  Consultations: Not applicable  Discharge Concerns: Safety  Estimated LOS: 3 to 5 days  Other:      Physician Treatment Plan for Primary Diagnosis: MDD (major depressive disorder), recurrent episode, severe (HCC) Long Term Goal(s): Improvement in symptoms so as ready for discharge   Short Term Goals: Ability to identify changes in lifestyle to reduce recurrence of condition will improve, Ability to verbalize feelings will improve, Ability to disclose and discuss suicidal ideas, Ability to demonstrate self-control will improve, Ability to identify and develop effective coping behaviors will improve, Ability to maintain clinical measurements within normal limits will improve, Compliance with prescribed medications will improve, and Ability to identify triggers associated with substance abuse/mental health issues will improve   Treatment Plan Summary: Daily contact with patient to assess and evaluate symptoms and progress in treatment and Medication management   Plan: Safety and Monitoring: Voluntary admission to inpatient psychiatric unit for safety, stabilization and treatment Daily contact with patient to assess and evaluate symptoms and progress in treatment Patient's case to be discussed in multi-disciplinary team meeting Observation Level : q15 minute checks Vital signs: q12 hours Precautions: suicide, but pt currently verbally contracts for safety on unit    Major depressive disorder recurrent severe -- Initiate mirtazapine (Remeron) 15 mg disintegrating tabs p.o. q. Nightly.   Anxiety -Initiate hydroxyzine 25 mg 3 times daily as needed/anxiety   Insomnia -Initiate trazodone 50 mg p.o. q. nightly as needed   Constipation --Initiate Colace 100 mg capsule 1 p.o. twice daily as needed for mild constipation (Home medication). -- Polyethylene glycol MiraLAX/GlycoLax 17 g by mouth p.o. as needed for moderate constipation.   Smoking cessation Nicotine  NicoDerm CQ -dose in milligrams per 24 hours 21 mg per 24 hours patch 1 patch onto skin daily   Agitation protocol Respite all disintegrating tabs 2 mg p.o. every 8 hours as needed agitation and Lorazepam tablet 1 mg p.o. as needed severe agitation x 1 dose only and Ziprasidone Geodon injection 20 mg IM as needed agitation x 1 dose only   Other PRN Medications -Acetaminophen 650 mg every 6 as needed/mild pain -Maalox 30 mL oral every 4 as needed/digestion -Magnesium hydroxide 30 mL daily  as needed/mild constipation   Discharge Planning: Social work and case management to assist with discharge planning and identification of hospital follow-up needs prior to discharge Estimated LOS: 5-7 days Discharge Concerns: Need to establish a safety plan; Medication compliance and effectiveness Discharge Goals: Return home with outpatient referrals for mental health follow-up including medication management/psychotherapy.   I certify that inpatient services furnished can reasonably be expected to improve the patient's condition.     Cecilie Lowersina C Sallye Lunz, FNP 11/10/2022, 11:14 AM

## 2022-11-10 NOTE — Group Note (Signed)
[facility] LCSW Group Therapy Note  Date/Time:  11/10/2022   Type of Therapy and Topic:  Group could not be held today.  A handout to read on the topic of developing a healthy support system was given to each patient.  They were encouraged to discuss this with their peers.  Handout:  A handout entitled "Developing Your Support System" was provided.  This handout discusses the benefits of a social support system, how to sustain current relationships, some ideas for building a social support system, and why it is important to cultivate your social support system now.  Therapeutic Modalities:   Handout  Beverley Allender Grossman-Orr, LCSW        

## 2022-11-10 NOTE — Group Note (Signed)
Date:  11/10/2022 Time:  5:41 PM  Group Topic/Focus:  Dimensions of Wellness:   The focus of this group is to introduce the topic of wellness and discuss the role each dimension of wellness plays in total health.    Participation Level:  Active  Participation Quality:  Appropriate  Affect:  Appropriate  Cognitive:  Appropriate  Insight: Appropriate  Engagement in Group:  Improving  Modes of Intervention:  Exploration  Additional Comments:     Reymundo Poll 11/10/2022, 5:41 PM

## 2022-11-10 NOTE — Group Note (Unsigned)
Date:  11/10/2022 Time:  2:09 PM  Group Topic/Focus:  Goals Group:   The focus of this group is to help patients establish daily goals to achieve during treatment and discuss how the patient can incorporate goal setting into their daily lives to aide in recovery. Orientation:   The focus of this group is to educate the patient on the purpose and policies of crisis stabilization and provide a format to answer questions about their admission.  The group details unit policies and expectations of patients while admitted.     Participation Level:  {[facility] PARTICIPATION LEVEL:22264}  Participation Quality:  {[facility] PARTICIPATION QUALITY:22265}  Affect:  {[facility] AFFECT:22266}  Cognitive:  {[facility] COGNITIVE:22267}  Insight: {[facility] Insight2:20797}  Engagement in Group:  {[facility] ENGAGEMENT IN GROUP:22268}  Modes of Intervention:  {[facility] MODES OF INTERVENTION:22269}  Additional Comments:  ***  Yue Glasheen Lashawn Charene Mccallister 11/10/2022, 2:09 PM  

## 2022-11-11 DIAGNOSIS — F332 Major depressive disorder, recurrent severe without psychotic features: Secondary | ICD-10-CM | POA: Diagnosis not present

## 2022-11-11 MED ORDER — MIRTAZAPINE 15 MG PO TABS: 15.0000 mg | ORAL_TABLET | Freq: Every day | ORAL | 0 refills | Status: DC

## 2022-11-11 MED ORDER — MIRTAZAPINE 15 MG PO TABS: 15.0000 mg | ORAL_TABLET | Freq: Every day | ORAL | Status: DC

## 2022-11-11 NOTE — Progress Notes (Signed)
Patient discharged. Discharge summary reviewed. Patient verbalized understanding. Patient received all personal belongings. Patient left unit at 1126.

## 2022-11-11 NOTE — Progress Notes (Signed)
   11/11/22 0900  Psych Admission Type (Psych Patients Only)  Admission Status Voluntary  Psychosocial Assessment  Patient Complaints None  Eye Contact Fair  Facial Expression Animated  Affect Appropriate to circumstance  Speech Logical/coherent  Interaction Assertive  Motor Activity Slow  Appearance/Hygiene Unremarkable  Behavior Characteristics Cooperative  Mood Pleasant  Thought Process  Coherency WDL  Content WDL  Delusions None reported or observed  Perception WDL  Hallucination None reported or observed  Judgment WDL  Confusion None  Danger to Self  Current suicidal ideation? Denies  Danger to Others  Danger to Others None reported or observed

## 2022-11-11 NOTE — Progress Notes (Signed)
  [facility] Adult Case Management Discharge Plan :  Will you be returning to the same living situation after discharge:  Yes,   mother Misty Stanley [phone], At discharge, do you have transportation home?: Yes,   mother Misty Stanley [phone], Do you have the ability to pay for your medications: Yes,  Insured  Release of information consent forms completed and in the chart;  Patient's signature needed at discharge.  Patient to Follow up at: pt declined all follow-up. CSW provided a list of resources for counseling   Next level of care provider has access to [facility]:yes  Safety Planning and Suicide Prevention discussed: Yes,   mother Misty Stanley [phone],     Has patient been referred to the Quitline?: Patient refused referral  Patient has been referred for addiction treatment: N/A Patient to continue working towards treatment goals after discharge. Patient no longer meets criteria for inpatient criteria per attending physician. Continue taking medications as prescribed, nursing to provide instructions at discharge. Follow up with all scheduled appointments.   Armaan Pond S Kron Everton, LCSW 11/11/2022, 9:49 AM

## 2022-11-11 NOTE — Discharge Summary (Signed)
Physician Discharge Summary Note  Patient:  Stephen Underwood is an 21 y.o., male  MRN:  [id number]  DOB:  [birth year]  Patient phone:  [phone] (home)   Patient address:   [place] [id number],  Total Time spent with patient: 1 hour  Date of Admission:  11/08/2022 Date of Discharge:   11/11/2022  Reason for Admission:  Stephen Underwood is a 21 year old male who presents voluntarily to [facility] from [facility] for recent suicide attempt by overdose on Xanax, Percocet, ecstasy, and cocaine.  Patient states that he attended a party and had too much fun when he overdosed on the illegal drugs.  Reports that this is his first attempt with these types of drugs however, it was not to commit suicide.  Reports using marijuana previously as his drug of choice however, not to harm himself.  He reports that his fiance dropped him off at the emergency room on 11/05/2022, because he was unresponsive from these drugs effect.  Patient has psychiatric diagnoses significant for major depressive disorder recurrent episode severe, and ADHD.  Also past medical history for chronic respiratory failure with hypoxia and status epilepticus.  He lives at home with his mother, dad and siblings.   Principal Problem: MDD (major depressive disorder), recurrent episode, severe ([place])  Discharge Diagnoses: Principal Problem:   MDD (major depressive disorder), recurrent episode, severe ([place]) SI  Past Psychiatric History:  Previous Psych Diagnoses: Major depressive disorder without psychotic features. Prior inpatient treatment: Not applicable Current/prior outpatient treatment: Not applicable Prior rehab hx: Not applicable Psychotherapy hx: Yes as a teenager History of suicide: Patient denies History of homicide or aggression: Patient denies Psychiatric medication history: Patient treated with Vyvanse in the past for ADD Psychiatric medication compliance  history: Yes Neuromodulation history: Not applicable Current Psychiatrist: Not applicable Current therapist: Not applicable    Past Medical History: Medical Diagnoses: Major depressive disorder recurrent episode severe Home FX:OVANVBTY sodium Colace 100 mg capsule Prior Hosp: None Prior Surgeries/Trauma: None none head trauma: None, LOC, concussions, seizures: Patient has history of status epilepticus Allergies: No known allergies LMP: Not applicable Contraception: Not applicable PCP: Patient does not remember PCP  Family History: History reviewed. No pertinent family history.  Family Psychiatric  History: Medical: Mother diagnosed with anxiety and depression Psych: Mother diagnosed with anxiety and depression Psych Rx: Patient does not remember SA/HA: Not applicable Substance use family hx: Not applicable   Social History:  Social History   Substance and Sexual Activity  Alcohol Use None     Social History   Substance and Sexual Activity  Drug Use Not on file    Social History   Socioeconomic History   Marital status: Single    Spouse name: Not on file   Number of children: Not on file   Years of education: Not on file   Highest education level: Not on file  Occupational History   Not on file  Tobacco Use   Smoking status: Never   Smokeless tobacco: Former  Scientific laboratory technician Use: Every day  Substance and Sexual Activity   Alcohol use: Not on file   Drug use: Not on file   Sexual activity: Not on file  Other Topics Concern   Not on file  Social History Narrative   Not on file   Social Determinants of Health   Financial Resource Strain: Not on file  Food Insecurity: No Food Insecurity (11/09/2022)   Hunger Vital  Sign    Worried About Charity fundraiser in the Last Year: Never true    [place] in the Last Year: Never true  Transportation Needs: No Transportation Needs (11/09/2022)   PRAPARE - Hydrologist  (Medical): No    Lack of Transportation (Non-Medical): No  Physical Activity: Not on file  Stress: Not on file  Social Connections: Not on file    Hospital Course:  Hospital Course:   Patient was admitted to the Adult unit at [facility] under the service of Dr. Caswell Corwin.  Routine labs reviewed, no new labs. CBC with diff: Normal. CMP: AST 12 low, indirect bilirubin 1.3 high, total bilirubin 1.5 high, otherwise noraml. Lipid panel: Normal. UDS: Positive for benzodiazepines, cocaine, tetrahydrocannabinol.  Patient to follow-up with outpatient PCP for all abnormal labs. Maintained Q 15 minutes observation for safety. During this admission patient did not require any change in her observation level and no PRN or time out  was required. Length of stay was 4 days.  During this hospitalization the patient will receive psychosocial  assessment. Patient participated in  all forms of therapy including; group, milieu, and family therapy. Psychotherapy: Social and Airline pilot, anti-bullying, learning based strategies, cognitive behavioral, and family object relations individuation separation intervention psychotherapies can be considered.  During this admission patient met with his psychiatrist daily and received full nursing service. An individualized treatment plan according to the patient's age, level of functioning, diagnostic considerations and acute behavior was initiated.  To continue to reduce current symptoms to baseline and improve the patient's overall level of functioning, continue mirtazapine disintegrating tablets 15 mg p.o. daily at bedtime for depression and appetite stimulation, trazodone 50 mg p.o. daily at bedtime as needed for sleep.  Patient is tolerating medications well with no side effects at discharge. Patient was able to verbalize reasons for living and a safety plan was developed. He appears to have a positive outlook and is agreeable to  continued therapy and medication management. Safety plan was discussed with guardian who is in agreement. Patient was provided with the national suicide hotline # 1-800-273-TALK as well as [facility]  number. Medications on admission were Agitation protocol of Risperdal 2 mg every 8 hours as needed for agitation, lorazepam tablet 1 mg p.o. as needed anxiety agitation x 1 dose only, and Zyprexa done injection 20 mg IM as needed agitation for 1 dose only, which patient did not receive.  Remeron disintegrating tablet 15 mg p.o. at bedtime for depression and appetite stimulation, trazodone 50 mg p.o. as needed nightly for sleep, Zofran 4 mg p.o. as needed for nausea.  Patient has responded well to these medications and is sleeping better.  At discharge, patient is medically stable and basic physical exam is within normal limits with no abnormal findings.   Patient appeared to benefit from the structure and consistency of the inpatient unit, continued current medication therapy, and integrated therapies. During the hospitalization the patient gradually improved and expressed no suicidal or homicidal thoughts, plan or intent to act on a plan. His depressive symptoms and anxiety have appeared to subside, and he has a positive outlook on her life going forward. He is able to express his feelings, and recognize when he is feeling overwhelmed. He has been calm and cooperative with no behavioral issues during this hospitalization.  A discharge conference was held with during which findings, recommendations, safety plans and aftercare plan were  discussed with the caregivers. Please refer to the therapist note for further information about issues discussed on family session. At discharge patient denied psychotic symptoms, suicidal and homicidal ideation, intent or plan, and there was no evidence of manic or  Depressive symptoms. Patient is discharged home in stable condition.  Physical  Findings: AIMS:  , ,  ,  ,    CIWA:    COWS:     Musculoskeletal: Strength & Muscle Tone: within normal limits Gait & Station: normal Patient leans: N/A   Psychiatric Specialty Exam:  Presentation  General Appearance:  Appropriate for Environment; Casual; Fairly Groomed  Eye Contact: Good  Speech: Clear and Coherent; Normal Rate  Speech Volume: Normal  Handedness: Right  Mood and Affect  Mood: Euthymic  Affect: Appropriate; Congruent  Thought Process  Thought Processes: Coherent; Goal Directed  Descriptions of Associations:Intact  Orientation:Full (Time, Place and Person)  Thought Content:Logical  History of Schizophrenia/Schizoaffective disorder:No data recorded Duration of Psychotic Symptoms:No data recorded Hallucinations:Hallucinations: None  Ideas of Reference:None  Suicidal Thoughts:Suicidal Thoughts: No SI Active Intent and/or Plan: -- (Denies)  Homicidal Thoughts:Homicidal Thoughts: No  Sensorium  Memory: Immediate Good; Recent Good  Judgment: Good  Insight: Good  Executive Functions  Concentration: Good  Attention Span: Good  Recall: [place] of Knowledge: Fair  Language: Good  Psychomotor Activity  Psychomotor Activity: Psychomotor Activity: Normal  Assets  Assets: Communication Skills; Desire for Improvement; Physical Health; Social Support  Sleep  Sleep: Sleep: Good Number of Hours of Sleep: 9  Physical Exam: Physical Exam Vitals and nursing note reviewed.  HENT:     Head: Normocephalic.     Nose: Nose normal.     Mouth/Throat:     Mouth: Mucous membranes are moist.     Pharynx: Oropharynx is clear.  Eyes:     Extraocular Movements: Extraocular movements intact.     Conjunctiva/sclera: Conjunctivae normal.     Pupils: Pupils are equal, round, and reactive to light.  Cardiovascular:     Rate and Rhythm: Normal rate.     Pulses: Normal pulses.  Pulmonary:     Effort: Pulmonary effort is  normal.  Abdominal:     Palpations: Abdomen is soft.  Genitourinary:    Comments: Deferred Musculoskeletal:        General: Normal range of motion.     Cervical back: Normal range of motion.  Skin:    General: Skin is warm.  Neurological:     General: No focal deficit present.     Mental Status: He is alert and oriented to person, place, and time.  Psychiatric:        Mood and Affect: Mood normal.        Behavior: Behavior normal.   Review of Systems  Constitutional: Negative.   HENT: Negative.    Respiratory: Negative.    Cardiovascular: Negative.   Gastrointestinal: Negative.   Genitourinary: Negative.   Musculoskeletal: Negative.   Skin: Negative.   Neurological:  Positive for seizures (History of seizures).  Endo/Heme/Allergies: Negative.   Psychiatric/Behavioral:  Positive for depression (Stable with medication) and substance abuse (Patient denies).    Blood pressure 124/76, pulse 75, temperature (!) 97.5 F (36.4 C), temperature source Oral, resp. rate 18, height _0  (1.88 m), weight 64.9 kg, SpO2 100 %. Body mass index is 18.36 kg/m.   Social History   Tobacco Use  Smoking Status Never  Smokeless Tobacco Former   Tobacco Cessation:  A prescription for an FDA-approved tobacco  cessation medication provided at discharge  Blood Alcohol level:  Lab Results  Component Value Date   ETH <10 40/98/1191   Metabolic Disorder Labs:  No results found for: "HGBA1C", "MPG" No results found for: "PROLACTIN" Lab Results  Component Value Date   TRIG 72 11/06/2022   See Psychiatric Specialty Exam and Suicide Risk Assessment completed by Attending Physician prior to discharge.  Discharge destination:  Home  Is patient on multiple antipsychotic therapies at discharge:  No   Has Patient had three or more failed trials of antipsychotic monotherapy by history:  No  Recommended Plan for Multiple Antipsychotic Therapies: NA  Discharge Instructions     Diet - low  sodium heart healthy   Complete by: As directed    Increase activity slowly   Complete by: As directed       Allergies as of 11/11/2022   No Known Allergies      Medication List     STOP taking these medications    docusate sodium 100 MG capsule Commonly known as: COLACE   polyethylene glycol 17 g packet Commonly known as: MIRALAX / GLYCOLAX       TAKE these medications      Indication  mirtazapine 15 MG tablet Commonly known as: REMERON Take 1 tablet (15 mg total) by mouth at bedtime.  Indication: Major Depressive Disorder   nicotine 21 mg/24hr patch Commonly known as: NICODERM CQ - dosed in mg/24 hours Place 1 patch (21 mg total) onto the skin daily.  Indication: Nicotine Addiction         Follow-up recommendations:  Activity:  As tolerated Diet:  Regular low-sodium diet  Comments:    Discharge Recommendations:  The patient is being discharged with his family. Patient is to take his discharge medications as ordered.  See follow up above. We recommend that he participate in individual therapy to target uncontrollable agitation and substance abuse.  We recommend that he participate in family therapy to target the conflict with his family, to improve communication skills and conflict resolution skills.  Family is to initiate/implement a contingency based behavioral model to address patient's behavior. We recommend that he get AIMS scale, height, weight, blood pressure, fasting lipid panel, fasting blood sugar in three months from discharge as he's on atypical antipsychotics.  Patient will benefit from monitoring of recurrent suicidal ideation since patient is on antidepressant medication. The patient should abstain from all illicit substances and alcohol.  If the patient's symptoms worsen or do not continue to improve or if the patient becomes actively suicidal or homicidal then it is recommended that the patient return to the closest hospital emergency room or  call 911 for further evaluation and treatment. National Suicide Prevention Lifeline 1800-SUICIDE or 509-491-3041. Please follow up with your primary medical doctor for all other medical needs.  The patient has been educated on the possible side effects to medications and he/his guardian is to contact a medical professional and inform outpatient provider of any new side effects of medication. He s to take regular diet and activity as tolerated.  Will benefit from moderate daily exercise. Family was educated about removing/locking any firearms, medications or dangerous products from the home.  Signed: Laretta Bolster, FNP 11/11/2022

## 2022-11-11 NOTE — [facility] Group Notes (Signed)
Patient attended goals group. He shared that his goal is "getting use to me home support system but talking to them and engaging with them more".

## 2022-11-11 NOTE — [facility] Suicide Risk Assessment (Signed)
[facility] INPATIENT:  Family/Significant Other Suicide Prevention Education  Suicide Prevention Education:  Education Completed; 11-11-2022,  mother Misty Stanley [phone] whom the patient will be residing, and identified as the person(s) who will aid the patient in the event of a mental health crisis (suicidal ideations/suicide attempt).  With written consent from the patient, the family member/significant other has been provided the following suicide prevention education, prior to the and/or following the discharge of the patient.  The suicide prevention education provided includes the following: Suicide risk factors Suicide prevention and interventions National Suicide Hotline telephone number [facility] assessment telephone number [facility] Emergency Assistance 911 [facility] and/or Residential Mobile Crisis Unit telephone number  Request made of family/significant other to: Remove weapons (e.g., guns, rifles, knives), all items previously/currently identified as safety concern.   Remove drugs/medications (over-the-counter, prescriptions, illicit drugs), all items previously/currently identified as a safety concern.   mother Misty Stanley [phone], verbalizes understanding of the suicide prevention education information provided.  The family member/significant other agrees to remove the items of safety concern listed above.  Stephen Underwood 11/11/2022, 9:52 AM

## 2022-11-11 NOTE — [facility] Suicide Risk Assessment (Signed)
Suicide Risk Assessment  Discharge Assessment    [facility] Discharge Suicide Risk Assessment  Principal Problem: MDD (major depressive disorder), recurrent episode, severe ([facility]) Discharge Diagnoses: Principal Problem:   MDD (major depressive disorder), recurrent episode, severe ([facility])  Total Time spent with patient: 1 hour  Musculoskeletal: Strength & Muscle Tone: within normal limits Gait & Station: normal Patient leans: N/A  Psychiatric Specialty Exam  Presentation  General Appearance:  Appropriate for Environment; Casual; Fairly Groomed  Eye Contact: Good  Speech: Clear and Coherent; Normal Rate  Speech Volume: Normal  Handedness: Right  Mood and Affect  Mood: Euthymic  Duration of Depression Symptoms: No data recorded Affect: Appropriate; Congruent  Thought Process  Thought Processes: Coherent; Goal Directed  Descriptions of Associations:Intact  Orientation:Full (Time, Place and Person)  Thought Content:Logical  History of Schizophrenia/Schizoaffective disorder:No data recorded Duration of Psychotic Symptoms:No data recorded Hallucinations:Hallucinations: None  Ideas of Reference:None  Suicidal Thoughts:Suicidal Thoughts: No SI Active Intent and/or Plan: -- (Denies)  Homicidal Thoughts:Homicidal Thoughts: No  Sensorium  Memory: Immediate Good; Recent Good  Judgment: Good  Insight: Good  Executive Functions  Concentration: Good  Attention Span: Good  Recall: Fair  Fund of Knowledge: Fair  Language: Good  Psychomotor Activity  Psychomotor Activity: Psychomotor Activity: Normal  Assets  Assets: Communication Skills; Desire for Improvement; Physical Health; Social Support  Sleep  Sleep: Sleep: Good Number of Hours of Sleep: 9  Physical Exam: Physical Exam Vitals and nursing note reviewed.  Constitutional:      Appearance: Normal appearance.  HENT:     Head: Normocephalic.     Right Ear: External ear normal.      Left Ear: External ear normal.     Nose: Nose normal.     Mouth/Throat:     Mouth: Mucous membranes are moist.     Pharynx: Oropharynx is clear.  Eyes:     Extraocular Movements: Extraocular movements intact.     Conjunctiva/sclera: Conjunctivae normal.     Pupils: Pupils are equal, round, and reactive to light.  Cardiovascular:     Rate and Rhythm: Normal rate.     Pulses: Normal pulses.  Pulmonary:     Effort: Pulmonary effort is normal.  Abdominal:     Palpations: Abdomen is soft.  Genitourinary:    Comments: Deferred Musculoskeletal:        General: Normal range of motion.     Cervical back: Normal range of motion.  Skin:    General: Skin is warm.  Neurological:     General: No focal deficit present.     Mental Status: He is alert and oriented to person, place, and time.  Psychiatric:        Mood and Affect: Mood normal.        Behavior: Behavior normal.        Thought Content: Thought content normal.        Judgment: Judgment normal.    Review of Systems  Constitutional: Negative.   HENT: Negative.    Respiratory: Negative.    Gastrointestinal: Negative.   Genitourinary: Negative.   Musculoskeletal: Negative.   Skin: Negative.   Neurological:  Positive for seizures (History of seizures).  Endo/Heme/Allergies: Negative.   Psychiatric/Behavioral:  Positive for depression (Stable with medication), substance abuse (Patient denies) and suicidal ideas (Patient denies).    Blood pressure 124/76, pulse 75, temperature (!) 97.5 F (36.4 C), temperature source Oral, resp. rate 18, height 6\' 2"  (1.88 m), weight 64.9 kg, SpO2 100 %. Body mass  index is 18.36 kg/m.  Mental Status Per Nursing Assessment::   On Admission:  NA  Demographic Factors:  Male, Adolescent or young adult, and Caucasian  Loss Factors: NA  Historical Factors: Impulsivity  Risk Reduction Factors:   Sense of responsibility to family, Employed, Living with another person, especially a relative,  Positive social support, and Positive coping skills or problem solving skills  Continued Clinical Symptoms:  Depression:   Impulsivity Alcohol/Substance Abuse/Dependencies More than one psychiatric diagnosis Previous Psychiatric Diagnoses and Treatments  Cognitive Features That Contribute To Risk:  Polarized thinking    Suicide Risk:  Minimal: No identifiable suicidal ideation.  Patients presenting with no risk factors but with morbid ruminations; may be classified as minimal risk based on the severity of the depressive symptoms  Plan Of Care/Follow-up recommendations:  Discharge Recommendations:  The patient is being discharged with his family. Patient is to take his discharge medications as ordered.  See follow up above. We recommend that he participate in individual therapy to target uncontrollable agitation and substance abuse.  We recommend that he participate in family therapy to target the conflict with his family, to improve communication skills and conflict resolution skills.  Family is to initiate/implement a contingency based behavioral model to address patient's behavior. We recommend that he get AIMS scale, height, weight, blood pressure, fasting lipid panel, fasting blood sugar in three months from discharge as he's on atypical antipsychotics.  Patient will benefit from monitoring of recurrent suicidal ideation since patient is on antidepressant medication. The patient should abstain from all illicit substances and alcohol.  If the patient's symptoms worsen or do not continue to improve or if the patient becomes actively suicidal or homicidal then it is recommended that the patient return to the closest hospital emergency room or call 911 for further evaluation and treatment. National Suicide Prevention Lifeline 1800-SUICIDE or 418-186-8039. Please follow up with your primary medical doctor for all other medical needs.  The patient has been educated on the possible side  effects to medications and he/his guardian is to contact a medical professional and inform outpatient provider of any new side effects of medication. He is to take regular diet and activity as tolerated.  Will benefit from moderate daily exercise. Family was educated about removing/locking any firearms, medications or dangerous products from the home.   Activity:  As tolerated Diet:  Regular low-sodium diet  Cecilie Lowers, FNP 11/11/2022, 10:49 AM

## 2022-11-11 NOTE — Progress Notes (Signed)
   11/10/22 2100  Psych Admission Type (Psych Patients Only)  Admission Status Voluntary  Psychosocial Assessment  Patient Complaints None  Eye Contact Fair  Facial Expression Animated  Affect Appropriate to circumstance  Speech Logical/coherent  Interaction Assertive  Motor Activity Slow  Appearance/Hygiene Unremarkable  Behavior Characteristics Cooperative  Mood Pleasant  Thought Process  Coherency WDL  Content WDL  Delusions None reported or observed  Perception WDL  Hallucination None reported or observed  Judgment Poor  Confusion None  Danger to Self  Current suicidal ideation? Denies  Danger to Others  Danger to Others None reported or observed

## 2022-11-12 ENCOUNTER — Encounter (HOSPITAL_COMMUNITY): Payer: Self-pay

## 2022-11-20 LAB — DRUG PROFILE, UR, 9 DRUGS (LABCORP)
Amphetamines, Urine: NEGATIVE ng/mL
Barbiturate, Ur: NEGATIVE ng/mL
Benzodiazepine Quant, Ur: NEGATIVE ng/mL
Cannabinoid Quant, Ur: POSITIVE ng/mL — AB
Cocaine (Metab.): NEGATIVE ng/mL
Methadone Screen, Urine: NEGATIVE ng/mL
Opiate Quant, Ur: NEGATIVE ng/mL
Phencyclidine, Ur: NEGATIVE ng/mL
Propoxyphene, Urine: NEGATIVE ng/mL

## 2023-01-10 ENCOUNTER — Encounter (HOSPITAL_COMMUNITY): Payer: Self-pay | Admitting: General Surgery

## 2023-01-10 ENCOUNTER — Emergency Department (HOSPITAL_COMMUNITY): Payer: Federal, State, Local not specified - PPO

## 2023-01-10 ENCOUNTER — Inpatient Hospital Stay (HOSPITAL_COMMUNITY)
Admission: EM | Admit: 2023-01-10 | Discharge: 2023-01-15 | DRG: 124 | Disposition: A | Discharge status: Home / Self Care | Payer: Federal, State, Local not specified - PPO | Attending: General Surgery | Admitting: General Surgery

## 2023-01-10 ENCOUNTER — Inpatient Hospital Stay (HOSPITAL_COMMUNITY): Payer: Federal, State, Local not specified - PPO

## 2023-01-10 DIAGNOSIS — S0181XA Laceration without foreign body of other part of head, initial encounter: Secondary | ICD-10-CM | POA: Diagnosis present

## 2023-01-10 DIAGNOSIS — F1290 Cannabis use, unspecified, uncomplicated: Secondary | ICD-10-CM | POA: Diagnosis not present

## 2023-01-10 DIAGNOSIS — S73012A Posterior subluxation of left hip, initial encounter: Secondary | ICD-10-CM | POA: Diagnosis not present

## 2023-01-10 DIAGNOSIS — F17290 Nicotine dependence, other tobacco product, uncomplicated: Secondary | ICD-10-CM | POA: Diagnosis not present

## 2023-01-10 DIAGNOSIS — S73005A Unspecified dislocation of left hip, initial encounter: Secondary | ICD-10-CM | POA: Diagnosis not present

## 2023-01-10 DIAGNOSIS — Z820 Family history of epilepsy and other diseases of the nervous system: Secondary | ICD-10-CM | POA: Diagnosis not present

## 2023-01-10 DIAGNOSIS — S022XXA Fracture of nasal bones, initial encounter for closed fracture: Secondary | ICD-10-CM | POA: Diagnosis not present

## 2023-01-10 DIAGNOSIS — Z91018 Allergy to other foods: Secondary | ICD-10-CM

## 2023-01-10 DIAGNOSIS — R410 Disorientation, unspecified: Secondary | ICD-10-CM | POA: Diagnosis not present

## 2023-01-10 DIAGNOSIS — R413 Other amnesia: Secondary | ICD-10-CM | POA: Diagnosis not present

## 2023-01-10 DIAGNOSIS — R61 Generalized hyperhidrosis: Secondary | ICD-10-CM | POA: Diagnosis not present

## 2023-01-10 DIAGNOSIS — S31109A Unspecified open wound of abdominal wall, unspecified quadrant without penetration into peritoneal cavity, initial encounter: Principal | ICD-10-CM | POA: Diagnosis present

## 2023-01-10 DIAGNOSIS — R9431 Abnormal electrocardiogram [ECG] [EKG]: Secondary | ICD-10-CM | POA: Diagnosis not present

## 2023-01-10 DIAGNOSIS — S36116A Major laceration of liver, initial encounter: Secondary | ICD-10-CM | POA: Diagnosis present

## 2023-01-10 DIAGNOSIS — S32402A Unspecified fracture of left acetabulum, initial encounter for closed fracture: Secondary | ICD-10-CM | POA: Diagnosis not present

## 2023-01-10 DIAGNOSIS — Y92410 Unspecified street and highway as the place of occurrence of the external cause: Secondary | ICD-10-CM

## 2023-01-10 DIAGNOSIS — Z79899 Other long term (current) drug therapy: Secondary | ICD-10-CM

## 2023-01-10 DIAGNOSIS — S36115A Moderate laceration of liver, initial encounter: Secondary | ICD-10-CM | POA: Diagnosis not present

## 2023-01-10 DIAGNOSIS — Z041 Encounter for examination and observation following transport accident: Secondary | ICD-10-CM | POA: Diagnosis not present

## 2023-01-10 DIAGNOSIS — S01111A Laceration without foreign body of right eyelid and periocular area, initial encounter: Principal | ICD-10-CM | POA: Diagnosis present

## 2023-01-10 DIAGNOSIS — R220 Localized swelling, mass and lump, head: Secondary | ICD-10-CM | POA: Diagnosis not present

## 2023-01-10 DIAGNOSIS — Z23 Encounter for immunization: Secondary | ICD-10-CM

## 2023-01-10 DIAGNOSIS — N3289 Other specified disorders of bladder: Secondary | ICD-10-CM | POA: Diagnosis not present

## 2023-01-10 DIAGNOSIS — R404 Transient alteration of awareness: Secondary | ICD-10-CM | POA: Diagnosis not present

## 2023-01-10 DIAGNOSIS — Z8249 Family history of ischemic heart disease and other diseases of the circulatory system: Secondary | ICD-10-CM

## 2023-01-10 DIAGNOSIS — M6289 Other specified disorders of muscle: Secondary | ICD-10-CM | POA: Diagnosis not present

## 2023-01-10 HISTORY — DX: Unspecified convulsions: R56.9

## 2023-01-10 LAB — CBC
HCT: 45.7 % (ref 39.0–52.0)
Hemoglobin: 15.2 g/dL (ref 13.0–17.0)
MCH: 29.1 pg (ref 26.0–34.0)
MCHC: 33.3 g/dL (ref 30.0–36.0)
MCV: 87.4 fL (ref 80.0–100.0)
Platelets: 292 10*3/uL (ref 150–400)
RBC: 5.23 MIL/uL (ref 4.22–5.81)
RDW: 13.2 % (ref 11.5–15.5)
WBC: 14.1 10*3/uL — ABNORMAL HIGH (ref 4.0–10.5)
nRBC: 0 % (ref 0.0–0.2)

## 2023-01-10 LAB — SAMPLE TO BLOOD BANK

## 2023-01-10 LAB — COMPREHENSIVE METABOLIC PANEL
ALT: 296 U/L — ABNORMAL HIGH (ref 0–44)
AST: 303 U/L — ABNORMAL HIGH (ref 15–41)
Albumin: 4.4 g/dL (ref 3.5–5.0)
Alkaline Phosphatase: 85 U/L (ref 38–126)
Anion gap: 14 (ref 5–15)
BUN: 5 mg/dL — ABNORMAL LOW (ref 6–20)
CO2: 24 mmol/L (ref 22–32)
Calcium: 9.3 mg/dL (ref 8.9–10.3)
Chloride: 99 mmol/L (ref 98–111)
Creatinine, Ser: 1.18 mg/dL (ref 0.61–1.24)
GFR, Estimated: >60 mL/min (ref >60)
Glucose, Bld: 167 mg/dL — ABNORMAL HIGH (ref 70–99)
Potassium: 3.1 mmol/L — ABNORMAL LOW (ref 3.5–5.1)
Sodium: 137 mmol/L (ref 135–145)
Total Bilirubin: 1.5 mg/dL — ABNORMAL HIGH (ref 0.3–1.2)
Total Protein: 7.2 g/dL (ref 6.5–8.1)

## 2023-01-10 LAB — CBG MONITORING, ED: Glucose-Capillary: 168 mg/dL — ABNORMAL HIGH (ref 70–99)

## 2023-01-10 MED ORDER — HYDROMORPHONE HCL 1 MG/ML IJ SOLN
0.5000 mg | Freq: Once | INTRAMUSCULAR | Status: AC
  Administered 2023-01-10: 0.5 mg via INTRAVENOUS
  Filled 2023-01-10: qty 1

## 2023-01-10 MED ORDER — LIDOCAINE-EPINEPHRINE (PF) 2 %-1:200000 IJ SOLN
INTRAMUSCULAR | Status: AC
  Filled 2023-01-10: qty 20

## 2023-01-10 MED ORDER — IOHEXOL 350 MG/ML SOLN
75.0000 mL | Freq: Once | INTRAVENOUS | Status: AC | PRN
  Administered 2023-01-10: 75 mL via INTRAVENOUS

## 2023-01-10 MED ORDER — PROPOFOL 10 MG/ML IV BOLUS
INTRAVENOUS | Status: AC | PRN
  Administered 2023-01-10: 32.45 mg via INTRAVENOUS

## 2023-01-10 MED ORDER — ONDANSETRON HCL 4 MG/2ML IJ SOLN
4.0000 mg | Freq: Once | INTRAMUSCULAR | Status: AC
  Administered 2023-01-10: 4 mg via INTRAVENOUS
  Filled 2023-01-10: qty 2

## 2023-01-10 MED ORDER — PROPOFOL 10 MG/ML IV BOLUS
0.5000 mg/kg | Freq: Once | INTRAVENOUS | Status: AC
  Administered 2023-01-10: 32.5 mg via INTRAVENOUS
  Filled 2023-01-10: qty 20

## 2023-01-10 MED ORDER — TETANUS-DIPHTH-ACELL PERTUSSIS 5-2.5-18.5 LF-MCG/0.5 IM SUSY
0.5000 mL | PREFILLED_SYRINGE | Freq: Once | INTRAMUSCULAR | Status: AC
  Administered 2023-01-10: 0.5 mL via INTRAMUSCULAR
  Filled 2023-01-10: qty 0.5

## 2023-01-10 MED ORDER — LACTATED RINGERS IV SOLN
INTRAVENOUS | Status: AC | PRN
  Administered 2023-01-10: 125 mL/h via INTRAVENOUS
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
## 2023-01-11 ENCOUNTER — Inpatient Hospital Stay (HOSPITAL_COMMUNITY): Payer: Federal, State, Local not specified - PPO

## 2023-01-11 DIAGNOSIS — S73005A Unspecified dislocation of left hip, initial encounter: Secondary | ICD-10-CM

## 2023-01-11 LAB — CBC
HCT: 37.1 % — ABNORMAL LOW (ref 39.0–52.0)
HCT: 37.1 % — ABNORMAL LOW (ref 39.0–52.0)
HCT: 38.8 % — ABNORMAL LOW (ref 39.0–52.0)
Hemoglobin: 12.4 g/dL — ABNORMAL LOW (ref 13.0–17.0)
Hemoglobin: 12.5 g/dL — ABNORMAL LOW (ref 13.0–17.0)
Hemoglobin: 13.3 g/dL (ref 13.0–17.0)
MCH: 29 pg (ref 26.0–34.0)
MCH: 28.7 pg (ref 26.0–34.0)
MCH: 29.2 pg (ref 26.0–34.0)
MCHC: 33.4 g/dL (ref 30.0–36.0)
MCHC: 33.7 g/dL (ref 30.0–36.0)
MCHC: 34.3 g/dL (ref 30.0–36.0)
MCV: 86.7 fL (ref 80.0–100.0)
MCV: 85.1 fL (ref 80.0–100.0)
MCV: 85.3 fL (ref 80.0–100.0)
Platelets: 209 10*3/uL (ref 150–400)
Platelets: 199 10*3/uL (ref 150–400)
Platelets: 180 10*3/uL (ref 150–400)
RBC: 4.28 MIL/uL (ref 4.22–5.81)
RBC: 4.36 MIL/uL (ref 4.22–5.81)
RBC: 4.55 MIL/uL (ref 4.22–5.81)
RDW: 13.2 % (ref 11.5–15.5)
RDW: 13.2 % (ref 11.5–15.5)
RDW: 13.3 % (ref 11.5–15.5)
WBC: 12.7 10*3/uL — ABNORMAL HIGH (ref 4.0–10.5)
WBC: 9.1 10*3/uL (ref 4.0–10.5)
WBC: 8.6 10*3/uL (ref 4.0–10.5)
nRBC: 0 % (ref 0.0–0.2)
nRBC: 0 % (ref 0.0–0.2)
nRBC: 0 % (ref 0.0–0.2)

## 2023-01-11 LAB — BASIC METABOLIC PANEL
Anion gap: 6 (ref 5–15)
BUN: <5 mg/dL — ABNORMAL LOW (ref 6–20)
CO2: 27 mmol/L (ref 22–32)
Calcium: 8.9 mg/dL (ref 8.9–10.3)
Chloride: 105 mmol/L (ref 98–111)
Creatinine, Ser: 0.89 mg/dL (ref 0.61–1.24)
GFR, Estimated: >60 mL/min (ref >60)
Glucose, Bld: 123 mg/dL — ABNORMAL HIGH (ref 70–99)
Potassium: 3.8 mmol/L (ref 3.5–5.1)
Sodium: 138 mmol/L (ref 135–145)

## 2023-01-11 LAB — HIV ANTIBODY (ROUTINE TESTING W REFLEX): HIV Screen 4th Generation wRfx: NONREACTIVE

## 2023-01-11 LAB — ETHANOL: Alcohol, Ethyl (B): <10 mg/dL (ref <10)

## 2023-01-11 MED ORDER — ONDANSETRON 4 MG PO TBDP: 4.0000 mg | ORAL_TABLET | Freq: Four times a day (QID) | ORAL | Status: DC | PRN

## 2023-01-11 MED ORDER — HYDROMORPHONE HCL 1 MG/ML IJ SOLN
1.0000 mg | INTRAMUSCULAR | Status: DC | PRN
  Administered 2023-01-12 (×2): 1 mg via INTRAVENOUS
  Filled 2023-01-11 (×2): qty 1

## 2023-01-11 MED ORDER — ONDANSETRON HCL 4 MG/2ML IJ SOLN
4.0000 mg | Freq: Four times a day (QID) | INTRAMUSCULAR | Status: DC | PRN
  Administered 2023-01-13: 4 mg via INTRAVENOUS
  Filled 2023-01-11: qty 2

## 2023-01-11 MED ORDER — ACETAMINOPHEN 325 MG PO TABS
650.0000 mg | ORAL_TABLET | Freq: Four times a day (QID) | ORAL | Status: DC
  Administered 2023-01-11 – 2023-01-14 (×11): 650 mg via ORAL
  Filled 2023-01-11 (×11): qty 2

## 2023-01-11 MED ORDER — LACTATED RINGERS IV SOLN: INTRAVENOUS | Status: DC

## 2023-01-11 MED ORDER — METHOCARBAMOL 1000 MG/10ML IJ SOLN
1000.0000 mg | Freq: Three times a day (TID) | INTRAVENOUS | Status: DC
  Administered 2023-01-11 – 2023-01-13 (×7): 1000 mg via INTRAVENOUS
  Filled 2023-01-11 (×2): qty 1000
  Filled 2023-01-11: qty 10
  Filled 2023-01-11: qty 1000
  Filled 2023-01-11 (×5): qty 10

## 2023-01-11 MED ORDER — OXYCODONE HCL 5 MG PO TABS
5.0000 mg | ORAL_TABLET | ORAL | Status: DC | PRN
  Administered 2023-01-11 – 2023-01-13 (×6): 5 mg via ORAL
  Filled 2023-01-11 (×6): qty 1

## 2023-01-11 NOTE — Progress Notes (Signed)
Patient ID: Stephen Underwood, male   DOB: [birth year], 22 y.o.   MRN: [id number]  He feels well and the eye is tearing slightly on right.  Wounds are oozing and eye on right is swollen. He has clear vision and no diplopia. Everything closed except the abrasion areas.   Clean with H2O2 the crusting of the face and apply bacitracin BID. He stands he could care less about his nose fracture but told him in 1 week if it looks different than before injury he should come in for closed reduction discussion if he wants if fixed. There is a 3 week window to fix a nasal fracture before it heals.
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
## 2023-01-12 LAB — CBC
HCT: 37.1 % — ABNORMAL LOW (ref 39.0–52.0)
HCT: 38.3 % — ABNORMAL LOW (ref 39.0–52.0)
Hemoglobin: 12.7 g/dL — ABNORMAL LOW (ref 13.0–17.0)
Hemoglobin: 12.7 g/dL — ABNORMAL LOW (ref 13.0–17.0)
MCH: 28.9 pg (ref 26.0–34.0)
MCH: 29.5 pg (ref 26.0–34.0)
MCHC: 33.2 g/dL (ref 30.0–36.0)
MCHC: 34.2 g/dL (ref 30.0–36.0)
MCV: 86.3 fL (ref 80.0–100.0)
MCV: 87 fL (ref 80.0–100.0)
Platelets: 162 10*3/uL (ref 150–400)
Platelets: 166 10*3/uL (ref 150–400)
RBC: 4.3 MIL/uL (ref 4.22–5.81)
RBC: 4.4 MIL/uL (ref 4.22–5.81)
RDW: 13.3 % (ref 11.5–15.5)
RDW: 13.3 % (ref 11.5–15.5)
WBC: 8.6 10*3/uL (ref 4.0–10.5)
WBC: 8.6 10*3/uL (ref 4.0–10.5)
nRBC: 0 % (ref 0.0–0.2)
nRBC: 0 % (ref 0.0–0.2)

## 2023-01-12 LAB — BASIC METABOLIC PANEL
Anion gap: 8 (ref 5–15)
BUN: <5 mg/dL — ABNORMAL LOW (ref 6–20)
CO2: 27 mmol/L (ref 22–32)
Calcium: 9 mg/dL (ref 8.9–10.3)
Chloride: 104 mmol/L (ref 98–111)
Creatinine, Ser: 0.91 mg/dL (ref 0.61–1.24)
GFR, Estimated: >60 mL/min (ref >60)
Glucose, Bld: 93 mg/dL (ref 70–99)
Potassium: 3.7 mmol/L (ref 3.5–5.1)
Sodium: 139 mmol/L (ref 135–145)
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
## 2023-01-13 LAB — CBC
HCT: 38.1 % — ABNORMAL LOW (ref 39.0–52.0)
Hemoglobin: 12.8 g/dL — ABNORMAL LOW (ref 13.0–17.0)
MCH: 29 pg (ref 26.0–34.0)
MCHC: 33.6 g/dL (ref 30.0–36.0)
MCV: 86.2 fL (ref 80.0–100.0)
Platelets: 167 10*3/uL (ref 150–400)
RBC: 4.42 MIL/uL (ref 4.22–5.81)
RDW: 13.1 % (ref 11.5–15.5)
WBC: 8.5 10*3/uL (ref 4.0–10.5)
nRBC: 0 % (ref 0.0–0.2)

## 2023-01-13 LAB — BASIC METABOLIC PANEL
Anion gap: 12 (ref 5–15)
BUN: 9 mg/dL (ref 6–20)
CO2: 23 mmol/L (ref 22–32)
Calcium: 8.9 mg/dL (ref 8.9–10.3)
Chloride: 102 mmol/L (ref 98–111)
Creatinine, Ser: 0.94 mg/dL (ref 0.61–1.24)
GFR, Estimated: >60 mL/min (ref >60)
Glucose, Bld: 101 mg/dL — ABNORMAL HIGH (ref 70–99)
Potassium: 3.4 mmol/L — ABNORMAL LOW (ref 3.5–5.1)
Sodium: 137 mmol/L (ref 135–145)

## 2023-01-13 MED ORDER — BACITRACIN ZINC 500 UNIT/GM EX OINT
TOPICAL_OINTMENT | Freq: Two times a day (BID) | CUTANEOUS | Status: DC
  Filled 2023-01-13: qty 28.4

## 2023-01-13 MED ORDER — SODIUM CHLORIDE 0.9 % IV SOLN: 250.0000 mL | INTRAVENOUS | Status: DC | PRN

## 2023-01-13 MED ORDER — POTASSIUM CHLORIDE CRYS ER 20 MEQ PO TBCR
40.0000 meq | EXTENDED_RELEASE_TABLET | Freq: Two times a day (BID) | ORAL | Status: AC
  Administered 2023-01-13 (×2): 40 meq via ORAL
  Filled 2023-01-13 (×2): qty 2

## 2023-01-13 MED ORDER — SODIUM CHLORIDE 0.9% FLUSH: 3.0000 mL | INTRAVENOUS | Status: DC | PRN

## 2023-01-13 MED ORDER — METHOCARBAMOL 500 MG PO TABS
1000.0000 mg | ORAL_TABLET | Freq: Three times a day (TID) | ORAL | Status: DC
  Administered 2023-01-13 – 2023-01-15 (×7): 1000 mg via ORAL
  Filled 2023-01-13 (×7): qty 2

## 2023-01-13 MED ORDER — HYDROMORPHONE HCL 1 MG/ML IJ SOLN
0.5000 mg | Freq: Four times a day (QID) | INTRAMUSCULAR | Status: DC | PRN
  Administered 2023-01-13 – 2023-01-14 (×2): 0.5 mg via INTRAVENOUS
  Filled 2023-01-13 (×2): qty 0.5

## 2023-01-13 MED ORDER — SODIUM CHLORIDE 0.9% FLUSH
3.0000 mL | Freq: Two times a day (BID) | INTRAVENOUS | Status: DC
  Administered 2023-01-13 – 2023-01-14 (×3): 3 mL via INTRAVENOUS

## 2023-01-13 MED ORDER — OXYCODONE HCL 5 MG PO TABS
5.0000 mg | ORAL_TABLET | ORAL | Status: DC | PRN
  Administered 2023-01-13 (×3): 10 mg via ORAL
  Administered 2023-01-13: 5 mg via ORAL
  Administered 2023-01-14 – 2023-01-15 (×3): 10 mg via ORAL
  Filled 2023-01-13 (×7): qty 2
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
## 2023-01-13 NOTE — Progress Notes (Signed)
Trauma Event Note   TRN rounds, pt awake on his computer. Reports headache, no PRNS due until 2300, notified primary RN via secure chat that he would like meds when available. Pt aware. Reports significant other on the way to the hospital. Denies other needs at this time, TRNs available 24/7 for assistance.   Stephen Underwood O Baruc Tugwell  Trauma Response RN  Please call TRN at [phone] for further assistance.

## 2023-01-13 NOTE — Progress Notes (Signed)
PT Cancellation Note  Patient Details Name: DEMAURION MICHALOWSKI MRN: [id number] DOB: 2000/12/27   Cancelled Treatment:    Reason Eval/Treat Not Completed: Active bedrest order; will follow up when activity orders upgraded.    Reginia Naas 01/13/2023, 9:31 AM Magda Kiel, PT Acute Rehabilitation Services Office:[phone] 01/13/2023

## 2023-01-13 NOTE — TOC CM/SW Note (Signed)
Patient requests letter be sent to his lawyer, as he has a court date on January 17, 2023.  Letter completed and emailed to patient's attorney at [email or web address].  Reinaldo Raddle, RN, BSN  Trauma/Neuro ICU Case Manager [phone]

## 2023-01-13 NOTE — Progress Notes (Signed)
Pt heart rhythm changed from SR to accelerated junctional (strip saved by central tele). Pt is asymptomatic. VS WNL. New leads placed on pt. EKG obtained. Dr. Bobbye Morton made aware.

## 2023-01-14 LAB — BASIC METABOLIC PANEL
Anion gap: 11 (ref 5–15)
BUN: 6 mg/dL (ref 6–20)
CO2: 25 mmol/L (ref 22–32)
Calcium: 9.2 mg/dL (ref 8.9–10.3)
Chloride: 100 mmol/L (ref 98–111)
Creatinine, Ser: 0.8 mg/dL (ref 0.61–1.24)
GFR, Estimated: >60 mL/min (ref >60)
Glucose, Bld: 105 mg/dL — ABNORMAL HIGH (ref 70–99)
Potassium: 3.9 mmol/L (ref 3.5–5.1)
Sodium: 136 mmol/L (ref 135–145)

## 2023-01-14 LAB — CBC
HCT: 42 % (ref 39.0–52.0)
Hemoglobin: 13.9 g/dL (ref 13.0–17.0)
MCH: 28.7 pg (ref 26.0–34.0)
MCHC: 33.1 g/dL (ref 30.0–36.0)
MCV: 86.6 fL (ref 80.0–100.0)
Platelets: 205 10*3/uL (ref 150–400)
RBC: 4.85 MIL/uL (ref 4.22–5.81)
RDW: 13.2 % (ref 11.5–15.5)
WBC: 7.3 10*3/uL (ref 4.0–10.5)
nRBC: 0 % (ref 0.0–0.2)

## 2023-01-14 MED ORDER — IBUPROFEN 200 MG PO TABS
600.0000 mg | ORAL_TABLET | Freq: Three times a day (TID) | ORAL | Status: DC
  Administered 2023-01-14 – 2023-01-15 (×3): 600 mg via ORAL
  Filled 2023-01-14 (×3): qty 3

## 2023-01-14 MED ORDER — ENOXAPARIN SODIUM 30 MG/0.3ML IJ SOSY
30.0000 mg | PREFILLED_SYRINGE | Freq: Two times a day (BID) | INTRAMUSCULAR | Status: DC
  Administered 2023-01-14 – 2023-01-15 (×3): 30 mg via SUBCUTANEOUS
  Filled 2023-01-14 (×3): qty 0.3

## 2023-01-14 MED ORDER — ACETAMINOPHEN 500 MG PO TABS
1000.0000 mg | ORAL_TABLET | Freq: Four times a day (QID) | ORAL | Status: DC
  Administered 2023-01-14 (×2): 1000 mg via ORAL
  Filled 2023-01-14 (×2): qty 2
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
## 2023-01-15 ENCOUNTER — Other Ambulatory Visit (HOSPITAL_COMMUNITY): Payer: Self-pay

## 2023-01-15 LAB — CBC
HCT: 41.4 % (ref 39.0–52.0)
Hemoglobin: 14.1 g/dL (ref 13.0–17.0)
MCH: 29.1 pg (ref 26.0–34.0)
MCHC: 34.1 g/dL (ref 30.0–36.0)
MCV: 85.4 fL (ref 80.0–100.0)
Platelets: 228 10*3/uL (ref 150–400)
RBC: 4.85 MIL/uL (ref 4.22–5.81)
RDW: 13.1 % (ref 11.5–15.5)
WBC: 7.6 10*3/uL (ref 4.0–10.5)
nRBC: 0 % (ref 0.0–0.2)

## 2023-01-15 MED ORDER — DOCUSATE SODIUM 100 MG PO CAPS
100.0000 mg | ORAL_CAPSULE | Freq: Two times a day (BID) | ORAL | 2 refills | Status: DC
  Filled 2023-01-15: qty 60, 30d supply, fill #0

## 2023-01-15 MED ORDER — IBUPROFEN 600 MG PO TABS
600.0000 mg | ORAL_TABLET | Freq: Four times a day (QID) | ORAL | 1 refills | Status: DC
  Filled 2023-01-15: qty 120, 30d supply, fill #0

## 2023-01-15 MED ORDER — OXYCODONE HCL 5 MG PO TABS
5.0000 mg | ORAL_TABLET | ORAL | 0 refills | Status: DC | PRN
  Filled 2023-01-15: qty 28, 7d supply, fill #0

## 2023-01-15 MED ORDER — METHOCARBAMOL 750 MG PO TABS
750.0000 mg | ORAL_TABLET | Freq: Four times a day (QID) | ORAL | 1 refills | Status: DC
  Filled 2023-01-15: qty 120, 30d supply, fill #0

## 2023-01-15 MED ORDER — ACETAMINOPHEN 500 MG PO TABS
1000.0000 mg | ORAL_TABLET | Freq: Four times a day (QID) | ORAL | 3 refills | Status: DC
  Filled 2023-01-15: qty 60, 8d supply, fill #0

## 2023-01-15 NOTE — Progress Notes (Signed)
Pt discharged home with brother. Delrae Rend, SWOT RN, assisted in discharge - removed PIV's, went over discharge information, took pt to d/c lounge, and gave pt his TOC meds. Pt has no questions at this time.   Justice Rocher, RN
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
## 2023-01-15 NOTE — Discharge Instructions (Addendum)
Okay to shower.   No contact sports, activity that would cause risk of abdominal injury, or strenuous activity for 8 weeks.    May resume sexual activity when it is comfortable.   Pain regimen: take over-the-counter tylenol (acetaminophen) '1000mg'$  every six hours, the prescription ibuprofen ('600mg'$ ) every six hours and the robaxin (methocarbamol) '750mg'$  every six hours. With all three of these, you should be taking something every two hours. Example: tylenol ( acetaminophen) at 8am, ibuprofen at 10am, robaxin (methocarbamol) at 12pm, tylenol (acetaminophen) again at 2pm, ibuprofen again at 4pm, robaxin (methocarbamol) at 6pm. You also have a prescription for oxycodone, which should be taken if the tylenol (acetaminophen), ibuprofen, and robaxin (methocarbamol) are not enough to control your pain. You may take the oxycodone as frequently as every four hours as needed, but if you are taking the other medications as above, you should not need the oxycodone this frequently. You have also been given a prescription for colace (docusate) which is a stool softener. Please take this as prescribed because the oxycodone can cause constipation and the colace (docusate) will minimize or prevent constipation. Do not drive while taking or under the influence of the oxycodone as it is a narcotic medication.
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
## 2023-02-22 ENCOUNTER — Emergency Department (HOSPITAL_COMMUNITY): Payer: Federal, State, Local not specified - PPO

## 2023-02-22 ENCOUNTER — Other Ambulatory Visit: Payer: Self-pay

## 2023-02-22 ENCOUNTER — Encounter (HOSPITAL_COMMUNITY): Payer: Self-pay

## 2023-02-22 ENCOUNTER — Emergency Department (HOSPITAL_COMMUNITY)
Admission: EM | Admit: 2023-02-22 | Discharge: 2023-02-22 | Disposition: A | Discharge status: Home / Self Care | Payer: Federal, State, Local not specified - PPO | Attending: Student | Admitting: Student

## 2023-02-22 DIAGNOSIS — R000 Tachycardia, unspecified: Secondary | ICD-10-CM | POA: Diagnosis not present

## 2023-02-22 DIAGNOSIS — Z4789 Encounter for other orthopedic aftercare: Secondary | ICD-10-CM | POA: Diagnosis not present

## 2023-02-22 DIAGNOSIS — S73005A Unspecified dislocation of left hip, initial encounter: Secondary | ICD-10-CM | POA: Diagnosis not present

## 2023-02-22 DIAGNOSIS — Y92410 Unspecified street and highway as the place of occurrence of the external cause: Secondary | ICD-10-CM | POA: Diagnosis not present

## 2023-02-22 DIAGNOSIS — M24452 Recurrent dislocation, left hip: Secondary | ICD-10-CM | POA: Diagnosis not present

## 2023-02-22 DIAGNOSIS — S73034S Other anterior dislocation of right hip, sequela: Secondary | ICD-10-CM | POA: Diagnosis not present

## 2023-02-22 DIAGNOSIS — S73005S Unspecified dislocation of left hip, sequela: Secondary | ICD-10-CM | POA: Diagnosis not present

## 2023-02-22 DIAGNOSIS — T84021A Dislocation of internal left hip prosthesis, initial encounter: Secondary | ICD-10-CM | POA: Diagnosis not present

## 2023-02-22 DIAGNOSIS — S43015A Anterior dislocation of left humerus, initial encounter: Secondary | ICD-10-CM | POA: Diagnosis not present

## 2023-02-22 MED ORDER — HYDROMORPHONE HCL 1 MG/ML IJ SOLN
1.0000 mg | Freq: Once | INTRAMUSCULAR | Status: AC
  Administered 2023-02-22: 1 mg via INTRAVENOUS
  Filled 2023-02-22: qty 1

## 2023-02-22 MED ORDER — OXYCODONE-ACETAMINOPHEN 5-325 MG PO TABS: 1.0000 | ORAL_TABLET | Freq: Four times a day (QID) | ORAL | 0 refills | Status: DC | PRN

## 2023-02-22 MED ORDER — PROPOFOL 10 MG/ML IV BOLUS
0.5000 mg/kg | Freq: Once | INTRAVENOUS | Status: DC
  Filled 2023-02-22: qty 20

## 2023-02-22 NOTE — ED Notes (Signed)
Pt in room with girlfriend at this time and is no longer in pain as he states.

## 2023-02-22 NOTE — Discharge Instructions (Addendum)
Wear the knee immobilizer and use the crutches for walking or standing.  Call Dr. Cathrine Muster office on Monday to arrange follow-up appointment.
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
## 2023-02-22 NOTE — ED Notes (Signed)
Girlfriend at bedside talking with pt.

## 2023-02-22 NOTE — Sedation Documentation (Signed)
Propofol 200mg /20 ml  5 ml given at 1840 36ml at 1842 78ml at 1844

## 2023-02-22 NOTE — ED Notes (Signed)
Administered 33ml total of Propofol in 2x 40ml increments plus 33ml per dr verbal order in pts room. Wasted the other 68ml  of Propofol with Bonita Quin, Charity fundraiser.

## 2023-07-12 ENCOUNTER — Emergency Department (HOSPITAL_COMMUNITY): Payer: Federal, State, Local not specified - PPO

## 2023-07-12 ENCOUNTER — Emergency Department (HOSPITAL_COMMUNITY)
Admission: EM | Admit: 2023-07-12 | Discharge: 2023-07-12 | Disposition: A | Discharge status: Home / Self Care | Payer: Federal, State, Local not specified - PPO | Source: Home / Self Care | Attending: Emergency Medicine | Admitting: Emergency Medicine

## 2023-07-12 ENCOUNTER — Encounter (HOSPITAL_COMMUNITY): Payer: Self-pay

## 2023-07-12 DIAGNOSIS — W19XXXA Unspecified fall, initial encounter: Secondary | ICD-10-CM | POA: Diagnosis not present

## 2023-07-12 DIAGNOSIS — Y93K1 Activity, walking an animal: Secondary | ICD-10-CM | POA: Insufficient documentation

## 2023-07-12 DIAGNOSIS — S79912A Unspecified injury of left hip, initial encounter: Secondary | ICD-10-CM | POA: Insufficient documentation

## 2023-07-12 DIAGNOSIS — M25552 Pain in left hip: Secondary | ICD-10-CM | POA: Diagnosis not present

## 2023-07-12 DIAGNOSIS — X58XXXA Exposure to other specified factors, initial encounter: Secondary | ICD-10-CM | POA: Insufficient documentation

## 2023-07-12 MED ORDER — IBUPROFEN 400 MG PO TABS
600.0000 mg | ORAL_TABLET | Freq: Once | ORAL | Status: AC
  Administered 2023-07-12: 600 mg via ORAL
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
## 2023-07-12 NOTE — Discharge Instructions (Signed)
Evaluation for hip pain today was overall reassuring.  X-ray was negative for hip dislocation.  Recommend ice and ibuprofen at home.  Also recommend you follow-up with your PCP.
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
## 2023-09-24 ENCOUNTER — Emergency Department (HOSPITAL_COMMUNITY)
Admission: EM | Admit: 2023-09-24 | Discharge: 2023-09-24 | Disposition: A | Discharge status: Home / Self Care | Payer: Federal, State, Local not specified - PPO | Attending: Emergency Medicine | Admitting: Emergency Medicine

## 2023-09-24 ENCOUNTER — Encounter (HOSPITAL_COMMUNITY): Payer: Self-pay | Admitting: Emergency Medicine

## 2023-09-24 DIAGNOSIS — R9431 Abnormal electrocardiogram [ECG] [EKG]: Secondary | ICD-10-CM | POA: Diagnosis not present

## 2023-09-24 DIAGNOSIS — I10 Essential (primary) hypertension: Secondary | ICD-10-CM | POA: Diagnosis not present

## 2023-09-24 DIAGNOSIS — R252 Cramp and spasm: Secondary | ICD-10-CM | POA: Insufficient documentation

## 2023-09-24 DIAGNOSIS — R569 Unspecified convulsions: Secondary | ICD-10-CM | POA: Diagnosis not present

## 2023-09-24 DIAGNOSIS — R29898 Other symptoms and signs involving the musculoskeletal system: Secondary | ICD-10-CM | POA: Diagnosis not present

## 2023-09-24 LAB — COMPREHENSIVE METABOLIC PANEL
ALT: 15 U/L (ref 0–44)
AST: 15 U/L (ref 15–41)
Albumin: 4 g/dL (ref 3.5–5.0)
Alkaline Phosphatase: 74 U/L (ref 38–126)
Anion gap: 12 (ref 5–15)
BUN: 8 mg/dL (ref 6–20)
CO2: 23 mmol/L (ref 22–32)
Calcium: 9.3 mg/dL (ref 8.9–10.3)
Chloride: 102 mmol/L (ref 98–111)
Creatinine, Ser: 0.92 mg/dL (ref 0.61–1.24)
GFR, Estimated: >60 mL/min (ref >60)
Glucose, Bld: 99 mg/dL (ref 70–99)
Potassium: 3.7 mmol/L (ref 3.5–5.1)
Sodium: 137 mmol/L (ref 135–145)
Total Bilirubin: 1.1 mg/dL (ref <1.2)
Total Protein: 7 g/dL (ref 6.5–8.1)

## 2023-09-24 LAB — URINALYSIS, ROUTINE W REFLEX MICROSCOPIC
Bilirubin Urine: NEGATIVE
Glucose, UA: NEGATIVE mg/dL
Hgb urine dipstick: NEGATIVE
Ketones, ur: NEGATIVE mg/dL
Leukocytes,Ua: NEGATIVE
Nitrite: NEGATIVE
Protein, ur: NEGATIVE mg/dL
Specific Gravity, Urine: 1.006 (ref 1.005–1.030)
pH: 6 (ref 5.0–8.0)

## 2023-09-24 LAB — CBC WITH DIFFERENTIAL/PLATELET
Abs Immature Granulocytes: 0 10*3/uL (ref 0.00–0.07)
Basophils Absolute: 0 10*3/uL (ref 0.0–0.1)
Basophils Relative: 0 %
Eosinophils Absolute: 0.1 10*3/uL (ref 0.0–0.5)
Eosinophils Relative: 1 %
HCT: 43.2 % (ref 39.0–52.0)
Hemoglobin: 14.8 g/dL (ref 13.0–17.0)
Immature Granulocytes: 0 %
Lymphocytes Relative: 26 %
Lymphs Abs: 1.4 10*3/uL (ref 0.7–4.0)
MCH: 28.7 pg (ref 26.0–34.0)
MCHC: 34.3 g/dL (ref 30.0–36.0)
MCV: 83.9 fL (ref 80.0–100.0)
Monocytes Absolute: 0.3 10*3/uL (ref 0.1–1.0)
Monocytes Relative: 6 %
Neutro Abs: 3.6 10*3/uL (ref 1.7–7.7)
Neutrophils Relative %: 67 %
Platelets: 191 10*3/uL (ref 150–400)
RBC: 5.15 MIL/uL (ref 4.22–5.81)
RDW: 12.6 % (ref 11.5–15.5)
WBC: 5.5 10*3/uL (ref 4.0–10.5)
nRBC: 0 % (ref 0.0–0.2)

## 2023-09-24 LAB — I-STAT CHEM 8, ED
BUN: 8 mg/dL (ref 6–20)
Calcium, Ion: 1.13 mmol/L — ABNORMAL LOW (ref 1.15–1.40)
Chloride: 102 mmol/L (ref 98–111)
Creatinine, Ser: 1 mg/dL (ref 0.61–1.24)
Glucose, Bld: 101 mg/dL — ABNORMAL HIGH (ref 70–99)
HCT: 43 % (ref 39.0–52.0)
Hemoglobin: 14.6 g/dL (ref 13.0–17.0)
Potassium: 3.6 mmol/L (ref 3.5–5.1)
Sodium: 140 mmol/L (ref 135–145)
TCO2: 24 mmol/L (ref 22–32)

## 2023-09-24 LAB — RAPID URINE DRUG SCREEN, HOSP PERFORMED
Amphetamines: NOT DETECTED
Barbiturates: NOT DETECTED
Benzodiazepines: POSITIVE — AB
Cocaine: NOT DETECTED
Opiates: NOT DETECTED
Tetrahydrocannabinol: POSITIVE — AB

## 2023-09-24 LAB — MAGNESIUM: Magnesium: 2 mg/dL (ref 1.7–2.4)

## 2023-09-24 LAB — ETHANOL: Alcohol, Ethyl (B): <10 mg/dL (ref <10)

## 2023-09-24 LAB — CK: Total CK: 68 U/L (ref 49–397)

## 2023-09-24 LAB — PHOSPHORUS: Phosphorus: 1.9 mg/dL — ABNORMAL LOW (ref 2.5–4.6)

## 2023-09-24 MED ORDER — LACTATED RINGERS IV BOLUS
1000.0000 mL | Freq: Once | INTRAVENOUS | Status: AC
  Administered 2023-09-24: 1000 mL via INTRAVENOUS

## 2023-09-24 MED ORDER — DIAZEPAM 5 MG/ML IJ SOLN
5.0000 mg | Freq: Once | INTRAMUSCULAR | Status: AC
  Administered 2023-09-24: 5 mg via INTRAVENOUS
  Filled 2023-09-24: qty 2

## 2023-09-24 NOTE — ED Provider Notes (Signed)
EMERGENCY DEPARTMENT AT [facility] Provider Note   CSN: [id number] Arrival date & time: 09/24/23  1858     History  No chief complaint on file.   Stephen Underwood is a 22 y.o. male.  HPI Patient presents for diffuse pain and stiffness.  Medical history includes seizures, depression.  He does have a remote history of polysubstance abuse.  He states he has not used any hard drugs in the past year.  Currently, he uses CBD vapes only.  For the past week, he has had intermittent episodes of muscle stiffening and pain.  He had similar episode today which prompted a family member at his home to call EMS.  When EMS arrived on scene, he was alert and oriented.  When attempted to stand him up, he became very stiff and rigid.  He continued like this.  They noticed what appeared to be spasms in hands and feet.  He was given 2.5 mg of Versed during transit and this seemed to improve the symptoms.  Patient denies any prior episodes of this prior to the past week.  He states that he has been eating and drinking okay.  He is not currently taking any Ailey medications.    Home Medications Prior to Admission medications   Medication Sig Start Date End Date Taking? Authorizing Provider  acetaminophen (TYLENOL) 500 MG tablet Take 2 tablets (1,000 mg total) by mouth 4 (four) times daily. 01/15/23 01/15/24  Diamantina Monks, MD  docusate sodium (COLACE) 100 MG capsule Take 1 capsule (100 mg total) by mouth 2 (two) times daily. 01/15/23 01/15/24  Diamantina Monks, MD  ibuprofen (ADVIL) 600 MG tablet Take 1 tablet (600 mg total) by mouth 4 (four) times daily. 01/15/23   Diamantina Monks, MD  methocarbamol (ROBAXIN-750) 750 MG tablet Take 1 tablet (750 mg total) by mouth 4 (four) times daily. 01/15/23   Diamantina Monks, MD  oxyCODONE-acetaminophen (PERCOCET/ROXICET) 5-325 MG tablet Take 1 tablet by mouth every 6 (six) hours as needed. 02/22/23   Triplett, Tammy, PA-C      Allergies    Onion     Review of Systems   Review of Systems  Gastrointestinal:  Positive for abdominal pain.  Musculoskeletal:  Positive for arthralgias and myalgias.  Psychiatric/Behavioral:  The patient is nervous/anxious.   All other systems reviewed and are negative.   Physical Exam Updated Vital Signs BP 98/71   Pulse (!) 55   Temp (!) 97.4 F (36.3 C) (Oral)   Resp 18   SpO2 97%  Physical Exam Vitals and nursing note reviewed.  Constitutional:      General: He is not in acute distress.    Appearance: Normal appearance. He is well-developed. He is not ill-appearing, toxic-appearing or diaphoretic.  HENT:     Head: Normocephalic and atraumatic.     Right Ear: External ear normal.     Left Ear: External ear normal.     Nose: Nose normal.     Mouth/Throat:     Mouth: Mucous membranes are moist.  Eyes:     Extraocular Movements: Extraocular movements intact.     Conjunctiva/sclera: Conjunctivae normal.  Cardiovascular:     Rate and Rhythm: Normal rate and regular rhythm.  Pulmonary:     Effort: Pulmonary effort is normal. No respiratory distress.  Abdominal:     General: There is no distension.     Palpations: Abdomen is soft.     Tenderness: There is no abdominal  tenderness.  Musculoskeletal:        General: Tenderness (Diffuse) present. No swelling.     Cervical back: Normal range of motion and neck supple.  Skin:    General: Skin is warm and dry.     Capillary Refill: Capillary refill takes less than 2 seconds.     Coloration: Skin is not jaundiced or pale.  Neurological:     General: No focal deficit present.     Mental Status: He is alert and oriented to person, place, and time.     Cranial Nerves: No cranial nerve deficit.     Sensory: No sensory deficit.     Motor: Abnormal muscle tone present. No weakness.     Coordination: Coordination normal.  Psychiatric:        Mood and Affect: Mood normal.        Behavior: Behavior normal.     ED Results / Procedures /  Treatments   Labs (all labs ordered are listed, but only abnormal results are displayed) Labs Reviewed  PHOSPHORUS - Abnormal; Notable for the following components:      Result Value   Phosphorus 1.9 (*)    All other components within normal limits  URINALYSIS, ROUTINE W REFLEX MICROSCOPIC - Abnormal; Notable for the following components:   Color, Urine STRAW (*)    All other components within normal limits  I-STAT CHEM 8, ED - Abnormal; Notable for the following components:   Glucose, Bld 101 (*)    Calcium, Ion 1.13 (*)    All other components within normal limits  COMPREHENSIVE METABOLIC PANEL  CBC WITH DIFFERENTIAL/PLATELET  MAGNESIUM  ETHANOL  CK  RAPID URINE DRUG SCREEN, HOSP PERFORMED    EKG EKG Interpretation Date/Time:  Wednesday September 24 2023 19:09:33 EST Ventricular Rate:  68 PR Interval:  206 QRS Duration:  103 QT Interval:  385 QTC Calculation: 410 R Axis:   89  Text Interpretation: Sinus rhythm Borderline prolonged PR interval RSR' in V1 or V2, probably normal variant Confirmed by Gloris Manchester [id number]) on 09/24/2023 8:42:19 PM  Radiology No results found.  Procedures Procedures    Medications Ordered in ED Medications  lactated ringers bolus 1,000 mL (0 mLs Intravenous Stopped 09/24/23 2048)  diazepam (VALIUM) injection 5 mg (5 mg Intravenous Given 09/24/23 1915)    ED Course/ Medical Decision Making/ A&P                                 Medical Decision Making Amount and/or Complexity of Data Reviewed Labs: ordered.  Risk Prescription drug management.   This patient presents to the ED for concern of body stiffening, this involves an extensive number of treatment options, and is a complaint that carries with it a high risk of complications and morbidity.  The differential diagnosis includes anxiety, intoxication, electrolyte abnormalities, panic disorder, pseudoseizures   Co morbidities that complicate the patient evaluation  Depression,  remote substance abuse   Additional history obtained:  Additional history obtained from EMS, patient's mother External records from outside source obtained and reviewed including EMR   Lab Tests:  I Ordered, and personally interpreted labs.  The pertinent results include: Normal kidney function, normal electrolytes, normal hemoglobin, no leukocytosis  Cardiac Monitoring: / EKG:  The patient was maintained on a cardiac monitor.  I personally viewed and interpreted the cardiac monitored which showed an underlying rhythm of: Sinus rhythm   Problem List / ED  Course / Critical interventions / Medication management  Patient presenting for intermittent symptoms of full body stiffening and pain.  EMS witnessed this episode.  He did not lose consciousness at the time.  He did not appear to have any convulsive movements.  It did improve with IV Versed given prior to arrival.  On arrival, patient is alert and oriented.  He was placed on ED stretcher.  He did have some mild stiffening throughout his extremities.  He seems to have muscle tenderness.  He describes pain is everywhere.  IV fluids and Valium were ordered.  Patient was placed on cardiac monitor.  Workup was initiated.  Lab work was unremarkable.  On reassessment, patient is now asymptomatic.  His mother now accompanies him at bedside.  Mother states that he has been having episodes where he will stiffen and hyperventilate.  Symptoms described are not consistent with seizure.  He does not lose consciousness during these episodes.  He does not have any focal abnormal movements.  She thinks it may be related to a panic disorder.  Given absence of any other findings on workup today, this is a reasonable assumption.  Shared decision-making was had with patient and mother.  Will avoid prescribing any benzodiazepines given his history of substance abuse.  Patient would benefit from establishing PCP for follow-up.  They may want to trial SSRI or other  nonaddictive medication.  For now, patient and mother do feel comfortable with discharge home.  He was discharged in good condition. I ordered medication including IV fluids for hydration; Valium for stiffness Reevaluation of the patient after these medicines showed that the patient resolved I have reviewed the patients home medicines and have made adjustments as needed   Social Determinants of Health:  Does not have PCP        Final Clinical Impression(s) / ED Diagnoses Final diagnoses:  Muscle cramps    Rx / DC Orders ED Discharge Orders     None         Gloris Manchester, MD 09/24/23 2128

## 2023-09-24 NOTE — ED Triage Notes (Signed)
Pt here from home with c/o muscle tightness whole body becoming rigid unable to stand , ems gave 2.5mg  versed , pt arrived to the ED alert and oriented and able to move

## 2023-09-24 NOTE — Discharge Instructions (Addendum)
There are some telephone numbers below that you can call to establish a primary care doctor.  Any other primary care doctor office would be okay as well.  You would benefit from establishing a primary care doctor and following up with them for possible treatments that can help with these episodes.  There is also a list attached of resources for outpatient mental health services.  In the meantime, get good sleep.  Stay hydrated.  Return to the emergency department for any new or worsening symptoms of concern.

## 2023-09-29 ENCOUNTER — Emergency Department (HOSPITAL_COMMUNITY): Payer: Federal, State, Local not specified - PPO

## 2023-09-29 ENCOUNTER — Ambulatory Visit (HOSPITAL_COMMUNITY)
Admission: EM | Admit: 2023-09-29 | Discharge: 2023-09-29 | Disposition: A | Discharge status: Other Acute Inpatient Hospital | Payer: Federal, State, Local not specified - PPO | Attending: Family Medicine | Admitting: Family Medicine

## 2023-09-29 ENCOUNTER — Encounter (HOSPITAL_COMMUNITY): Payer: Self-pay | Admitting: *Deleted

## 2023-09-29 ENCOUNTER — Ambulatory Visit: Payer: Self-pay

## 2023-09-29 ENCOUNTER — Encounter (HOSPITAL_COMMUNITY): Payer: Self-pay

## 2023-09-29 ENCOUNTER — Other Ambulatory Visit: Payer: Self-pay

## 2023-09-29 ENCOUNTER — Inpatient Hospital Stay (HOSPITAL_COMMUNITY)
Admission: EM | Admit: 2023-09-29 | Discharge: 2023-10-01 | DRG: 101 | Disposition: A | Discharge status: Home / Self Care | Payer: Federal, State, Local not specified - PPO | Attending: Family Medicine | Admitting: Family Medicine

## 2023-09-29 DIAGNOSIS — F1910 Other psychoactive substance abuse, uncomplicated: Secondary | ICD-10-CM | POA: Diagnosis not present

## 2023-09-29 DIAGNOSIS — R569 Unspecified convulsions: Secondary | ICD-10-CM | POA: Diagnosis not present

## 2023-09-29 DIAGNOSIS — Z9151 Personal history of suicidal behavior: Secondary | ICD-10-CM | POA: Diagnosis not present

## 2023-09-29 DIAGNOSIS — F17290 Nicotine dependence, other tobacco product, uncomplicated: Secondary | ICD-10-CM | POA: Diagnosis present

## 2023-09-29 DIAGNOSIS — H9190 Unspecified hearing loss, unspecified ear: Secondary | ICD-10-CM | POA: Diagnosis not present

## 2023-09-29 DIAGNOSIS — E119 Type 2 diabetes mellitus without complications: Secondary | ICD-10-CM | POA: Diagnosis not present

## 2023-09-29 DIAGNOSIS — F419 Anxiety disorder, unspecified: Secondary | ICD-10-CM | POA: Diagnosis not present

## 2023-09-29 DIAGNOSIS — Z9109 Other allergy status, other than to drugs and biological substances: Secondary | ICD-10-CM

## 2023-09-29 DIAGNOSIS — F1210 Cannabis abuse, uncomplicated: Secondary | ICD-10-CM | POA: Diagnosis not present

## 2023-09-29 DIAGNOSIS — Z5982 Transportation insecurity: Secondary | ICD-10-CM

## 2023-09-29 DIAGNOSIS — Z820 Family history of epilepsy and other diseases of the nervous system: Secondary | ICD-10-CM

## 2023-09-29 DIAGNOSIS — Z818 Family history of other mental and behavioral disorders: Secondary | ICD-10-CM

## 2023-09-29 DIAGNOSIS — R197 Diarrhea, unspecified: Secondary | ICD-10-CM | POA: Diagnosis present

## 2023-09-29 DIAGNOSIS — F1310 Sedative, hypnotic or anxiolytic abuse, uncomplicated: Secondary | ICD-10-CM | POA: Diagnosis present

## 2023-09-29 DIAGNOSIS — R55 Syncope and collapse: Secondary | ICD-10-CM | POA: Diagnosis present

## 2023-09-29 DIAGNOSIS — Z23 Encounter for immunization: Secondary | ICD-10-CM

## 2023-09-29 DIAGNOSIS — R4182 Altered mental status, unspecified: Secondary | ICD-10-CM | POA: Diagnosis not present

## 2023-09-29 DIAGNOSIS — R1010 Upper abdominal pain, unspecified: Secondary | ICD-10-CM | POA: Diagnosis present

## 2023-09-29 DIAGNOSIS — R001 Bradycardia, unspecified: Secondary | ICD-10-CM | POA: Diagnosis not present

## 2023-09-29 DIAGNOSIS — F329 Major depressive disorder, single episode, unspecified: Secondary | ICD-10-CM | POA: Diagnosis present

## 2023-09-29 DIAGNOSIS — E872 Acidosis, unspecified: Secondary | ICD-10-CM

## 2023-09-29 DIAGNOSIS — E876 Hypokalemia: Secondary | ICD-10-CM | POA: Diagnosis present

## 2023-09-29 DIAGNOSIS — G4089 Other seizures: Secondary | ICD-10-CM | POA: Diagnosis not present

## 2023-09-29 DIAGNOSIS — R109 Unspecified abdominal pain: Secondary | ICD-10-CM | POA: Diagnosis not present

## 2023-09-29 DIAGNOSIS — Z8041 Family history of malignant neoplasm of ovary: Secondary | ICD-10-CM

## 2023-09-29 DIAGNOSIS — Z8249 Family history of ischemic heart disease and other diseases of the circulatory system: Secondary | ICD-10-CM

## 2023-09-29 HISTORY — DX: Other psychoactive substance abuse, uncomplicated: F19.10

## 2023-09-29 LAB — COMPREHENSIVE METABOLIC PANEL
ALT: 14 U/L (ref 0–44)
AST: 15 U/L (ref 15–41)
Albumin: 3.8 g/dL (ref 3.5–5.0)
Alkaline Phosphatase: 61 U/L (ref 38–126)
Anion gap: 10 (ref 5–15)
BUN: 8 mg/dL (ref 6–20)
CO2: 19 mmol/L — ABNORMAL LOW (ref 22–32)
Calcium: 8.8 mg/dL — ABNORMAL LOW (ref 8.9–10.3)
Chloride: 109 mmol/L (ref 98–111)
Creatinine, Ser: 0.82 mg/dL (ref 0.61–1.24)
GFR, Estimated: >60 mL/min (ref >60)
Glucose, Bld: 95 mg/dL (ref 70–99)
Potassium: 3.4 mmol/L — ABNORMAL LOW (ref 3.5–5.1)
Sodium: 138 mmol/L (ref 135–145)
Total Bilirubin: 1.4 mg/dL — ABNORMAL HIGH (ref <1.2)
Total Protein: 6.6 g/dL (ref 6.5–8.1)

## 2023-09-29 LAB — CBC WITH DIFFERENTIAL/PLATELET
Abs Immature Granulocytes: 0.04 10*3/uL (ref 0.00–0.07)
Basophils Absolute: 0 10*3/uL (ref 0.0–0.1)
Basophils Relative: 0 %
Eosinophils Absolute: 0 10*3/uL (ref 0.0–0.5)
Eosinophils Relative: 0 %
HCT: 41.3 % (ref 39.0–52.0)
Hemoglobin: 14.2 g/dL (ref 13.0–17.0)
Immature Granulocytes: 0 %
Lymphocytes Relative: 12 %
Lymphs Abs: 1.2 10*3/uL (ref 0.7–4.0)
MCH: 28.8 pg (ref 26.0–34.0)
MCHC: 34.4 g/dL (ref 30.0–36.0)
MCV: 83.8 fL (ref 80.0–100.0)
Monocytes Absolute: 0.5 10*3/uL (ref 0.1–1.0)
Monocytes Relative: 5 %
Neutro Abs: 7.6 10*3/uL (ref 1.7–7.7)
Neutrophils Relative %: 83 %
Platelets: 199 10*3/uL (ref 150–400)
RBC: 4.93 MIL/uL (ref 4.22–5.81)
RDW: 12.5 % (ref 11.5–15.5)
WBC: 9.3 10*3/uL (ref 4.0–10.5)
nRBC: 0 % (ref 0.0–0.2)

## 2023-09-29 LAB — URINALYSIS, ROUTINE W REFLEX MICROSCOPIC
Bilirubin Urine: NEGATIVE
Glucose, UA: NEGATIVE mg/dL
Hgb urine dipstick: NEGATIVE
Ketones, ur: 20 mg/dL — AB
Leukocytes,Ua: NEGATIVE
Nitrite: NEGATIVE
Protein, ur: NEGATIVE mg/dL
Specific Gravity, Urine: 1.009 (ref 1.005–1.030)
pH: 7 (ref 5.0–8.0)

## 2023-09-29 LAB — RAPID URINE DRUG SCREEN, HOSP PERFORMED
Amphetamines: NOT DETECTED
Barbiturates: NOT DETECTED
Benzodiazepines: POSITIVE — AB
Cocaine: NOT DETECTED
Opiates: NOT DETECTED
Tetrahydrocannabinol: POSITIVE — AB

## 2023-09-29 LAB — LIPASE, BLOOD: Lipase: 24 U/L (ref 11–51)

## 2023-09-29 LAB — TROPONIN I (HIGH SENSITIVITY)
Troponin I (High Sensitivity): <2 ng/L (ref <18)
Troponin I (High Sensitivity): <2 ng/L (ref <18)

## 2023-09-29 LAB — POCT FASTING CBG KUC MANUAL ENTRY: POCT Glucose (KUC): 118 mg/dL — AB (ref 70–99)

## 2023-09-29 LAB — TSH: TSH: 1.43 u[IU]/mL (ref 0.350–4.500)

## 2023-09-29 LAB — ETHANOL: Alcohol, Ethyl (B): <10 mg/dL (ref <10)

## 2023-09-29 LAB — MAGNESIUM: Magnesium: 1.9 mg/dL (ref 1.7–2.4)

## 2023-09-29 LAB — CK: Total CK: 89 U/L (ref 49–397)

## 2023-09-29 MED ORDER — ORAL CARE MOUTH RINSE: 15.0000 mL | OROMUCOSAL | Status: DC

## 2023-09-29 MED ORDER — IOHEXOL 350 MG/ML SOLN
75.0000 mL | Freq: Once | INTRAVENOUS | Status: AC | PRN
  Administered 2023-09-29: 75 mL via INTRAVENOUS

## 2023-09-29 MED ORDER — KETOROLAC TROMETHAMINE 30 MG/ML IJ SOLN
30.0000 mg | Freq: Once | INTRAMUSCULAR | Status: AC
  Administered 2023-09-29: 30 mg via INTRAVENOUS
  Filled 2023-09-29: qty 1

## 2023-09-29 MED ORDER — LORAZEPAM 2 MG/ML IJ SOLN: 1.0000 mg | INTRAMUSCULAR | Status: DC | PRN

## 2023-09-29 MED ORDER — INFLUENZA VIRUS VACC SPLIT PF (FLUZONE) 0.5 ML IM SUSY
0.5000 mL | PREFILLED_SYRINGE | INTRAMUSCULAR | Status: AC
  Administered 2023-10-01: 0.5 mL via INTRAMUSCULAR
  Filled 2023-09-29: qty 0.5

## 2023-09-29 MED ORDER — PROCHLORPERAZINE EDISYLATE 10 MG/2ML IJ SOLN
5.0000 mg | Freq: Once | INTRAMUSCULAR | Status: AC
Start: 2023-09-29 — End: 2023-09-29
  Administered 2023-09-29: 5 mg via INTRAVENOUS
  Filled 2023-09-29: qty 2

## 2023-09-29 MED ORDER — NAPROXEN 375 MG PO TABS
375.0000 mg | ORAL_TABLET | Freq: Four times a day (QID) | ORAL | Status: DC | PRN
  Administered 2023-09-29 – 2023-10-01 (×4): 375 mg via ORAL
  Filled 2023-09-29: qty 2
  Filled 2023-09-29 (×4): qty 1

## 2023-09-29 MED ORDER — SODIUM CHLORIDE 0.9 % IV BOLUS
1000.0000 mL | Freq: Once | INTRAVENOUS | Status: AC
  Administered 2023-09-29: 1000 mL via INTRAVENOUS

## 2023-09-29 MED ORDER — LORAZEPAM 2 MG/ML IJ SOLN
1.0000 mg | Freq: Once | INTRAMUSCULAR | Status: AC
  Administered 2023-09-29: 1 mg via INTRAVENOUS

## 2023-09-29 MED ORDER — ORAL CARE MOUTH RINSE: 15.0000 mL | OROMUCOSAL | Status: DC | PRN

## 2023-09-29 MED ORDER — LORAZEPAM 2 MG/ML IJ SOLN
INTRAMUSCULAR | Status: AC
  Filled 2023-09-29: qty 1

## 2023-09-29 NOTE — Telephone Encounter (Signed)
Pt. Currently in ED.

## 2023-09-29 NOTE — Assessment & Plan Note (Addendum)
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

## 2023-09-29 NOTE — Progress Notes (Signed)
Pt in hall with no plans to move to room. EEG to be attempted when pt arrives into room

## 2023-09-29 NOTE — H&P (Signed)
History and Physical    Patient: Stephen Underwood NFA:[id number] DOB: Mar 27, 2001 DOA: 09/29/2023 DOS: the patient was seen and examined on 09/29/2023 PCP: Pcp, No  Patient coming from:  Urgent care  Chief Complaint:  Chief Complaint  Patient presents with   Near Syncope   HPI: Stephen Underwood is a 22 y.o. male with medical history significant of pseudoseizures, polysubstance abuse, MDD and anxiety who presents from urgent care for syncope versus seizure-like activity.  Pt reports for the past 2 weeks he has felt "spacey" and intermittent confused about where he is. Other times have trouble getting words out. Says he has been having episodes where his extremities stiffens up and has to lay on the ground. He reports past hx of alcohol or drug use but denies any current use of marijuana other than CBD vapes.   Reportedly patient presented to urgent care today with the aforementioned symptoms and he was noted to have a near syncopal episode at the registration desk.  He was slowly helped to the ground by urgent care provider.  Patient was able to respond to some questions during this episode which lasted for 3 to 5 minutes.  Event stopped prior to administration of Ativan and he was not noted to have a postictal..  Patient noted to have alternating generalized tremors and stiffening while moaning and reporting generalized pain.  Had several episodes of not responding but was able to visually track his surroundings.  Pt was noted to have seizures in 10/2022 when he was dropped off by friends in he ED parking lot actively seizing after reportedly taken multiple doses of narcotics.He had continuous seizures and required intubation for airway protection. UDS before benzo administration was positive for cocaine, benzo, THC.  All other labs were normal.  Overnight EEG documented multiple event when pt was either not responding or had whole body violent shaking but no EEG evidence to suggest seizures.  Overall study at the time was suggestive of severe diffuse encephalopathy. Psychiatry was consulted during that hospitalization for suicidal ideations with overdose and he was discharged to inpatient psych.   On arrival to ED, he is afebrile, Bp of 104/70 and tachycardic HR of 113. Alert and oriented without any further seizure-like activity.   CBC is unremarkable without leukocytosis or anemia.  CMP notable for CO2 of 19 and mildly elevated total bilirubin 1.4.  Creatinine and LFT otherwise within normal limits.  CT head and CT abdomen and pelvis without any acute findings.  UDS is positive for benzo and THC-Ativan was administered at urgent care prior to transfer to ED  EKG on my review with right axis deviation, sinus bradycardia.  ED consulted neurology Dr. Otelia Limes who recommended admission for EEG.  Hospitalist then consulted for admission.    Review of Systems: As mentioned in the history of present illness. All other systems reviewed and are negative. Past Medical History:  Diagnosis Date   History of tympanostomy tube placement 2003   Mild to moderate hearing loss    Seizures (HCC)    No past surgical history on file. Social History:  reports that he has been smoking e-cigarettes. He has quit using smokeless tobacco. He reports that he does not currently use alcohol. He reports current drug use. Frequency: 3.00 times per week. Drug: Marijuana.  Allergies  Allergen Reactions   Onion Swelling    Throat swelling    Family History  Problem Relation Age of Onset   Anemia Mother    Depression Mother  Heart murmur Father    ADD / ADHD Father    Epilepsy Father    Peptic Ulcer Father    Bipolar disorder Father    Ovarian cancer Maternal Grandmother    Hypertension Paternal Grandfather    Liver disease Paternal Grandfather    Heart disease Paternal Grandfather     Prior to Admission medications   Not on File    Physical Exam: Vitals:   09/29/23 1144 09/29/23  1510 09/29/23 1533  BP: 104/70 (!) 100/58   Pulse: 71 (!) 113   Resp: 17 16   Temp: (!) 97.5 F (36.4 C)  98.4 F (36.9 C)  TempSrc: Oral  Oral  SpO2: 96% 97%    Constitutional: NAD, calm, comfortable, young male lying in bed awake Eyes: lids and conjunctivae normal ENMT: Mucous membranes are moist.  Neck: normal, supple Respiratory: clear to auscultation bilaterally, no wheezing, no crackles. Normal respiratory effort. No accessory muscle use.  Cardiovascular: Regular rate and rhythm, no murmurs / rubs / gallops. No extremity edema.  Abdomen: no tenderness, soft  musculoskeletal: no clubbing / cyanosis. No joint deformity upper and lower extremities. Good ROM, no contractures. Normal muscle tone.  Skin: no rashes, lesions, ulcers. No induration Neurologic: CN 2-12 grossly intact.  Strength 5/5 in all 4.  No facial asymmetry, equal bilateral shoulder shrug, intact finger-nose.  No speech delay or dysarthria. Psychiatric: Normal judgment and insight. Alert and oriented x 3. Normal mood. Data Reviewed:  See HPI  Assessment and Plan: * Seizure-like activity (HCC) -pt with hx of pseudoseizure with negative video EEG in 10/2022 -neurology consulted and recommends overnight EEG - no electrolyte abnormalities, CT head is negative -suspect psychiatric overlay to these episodes and will consult psychiatry  -scheduled neurochecks overnight -seizure precaution    Metabolic acidosis -has already received 1L of fluid bolus -follow labs in the morning  Polysubstance abuse (HCC) -hx of UDS positive for cocaine, THC and benzo. He denies any further use other than CBD vapes. UDS today is positive for benzo and THC although he received ativan at urgent care prior to presentation to ED.       Advance Care Planning:   Code Status: Full Code   Consults: neurology  Family Communication: None at bedside  Severity of Illness: The appropriate patient status for this patient is OBSERVATION.  Observation status is judged to be reasonable and necessary in order to provide the required intensity of service to ensure the patient's safety. The patient's presenting symptoms, physical exam findings, and initial radiographic and laboratory data in the context of their medical condition is felt to place them at decreased risk for further clinical deterioration. Furthermore, it is anticipated that the patient will be medically stable for discharge from the hospital within 2 midnights of admission.   Author: Anselm Jungling, DO 09/29/2023 7:41 PM  For on call review [email or web address].

## 2023-09-29 NOTE — ED Provider Notes (Signed)
[facility]    CSN: [id number] Arrival date & time: 09/29/23  1026      History   Chief Complaint Chief Complaint  Patient presents with   Near Syncope    HPI Stephen Underwood is a 22 y.o. male.  This provider saw patient at registration desk where he appeared to be near syncopal. When brought to triage he began to buck in the chair and was slowly helped to the ground by this provider. Patient responded to some questions during this episode. Unsure if partial seizure. Episode lasted 3-4 minutes, stopped before ativan was able to be administered. He did not have post-ictal period.   Patient was having tremors throughout the body and stating everything hurts. He had several episodes of non-responsive phase, visual tracking intact but would not respond to touch or sound. These lasted about a minute each.   He denies alcohol use. Reports history of alcohol induced seizure. No recent drug use. CBD pen about 2 weeks ago   Per nurse triage note he had called this morning regarding confusion, dizziness, blurred vision Was in the ED on 11/6 for episodes of muscle stiffness, given Versed by EMS Labs at that time were overall unremarkable. UDS + for benzo and THC  Past Medical History:  Diagnosis Date   History of tympanostomy tube placement 2003   Mild to moderate hearing loss    Seizures [facility])     Patient Active Problem List   Diagnosis Date Noted   Hip dislocation, left, initial encounter (HCC) 01/11/2023   Liver laceration, grade III, with open wound into cavity, initial encounter 01/10/2023   MDD (major depressive disorder), recurrent episode, severe (HCC) 11/08/2022   Status epilepticus (HCC) 11/05/2022   Chronic respiratory failure with hypoxia (HCC) 11/05/2022   Moderate malnutrition (HCC) 09/21/2019   Poor appetite 09/21/2019   Weight loss 09/20/2019   Vomiting 09/20/2019   Vision changes 09/20/2019    History reviewed. No pertinent surgical  history.    Home Medications    Prior to Admission medications   Medication Sig Start Date End Date Taking? Authorizing Provider  acetaminophen (TYLENOL) 500 MG tablet Take 2 tablets (1,000 mg total) by mouth 4 (four) times daily. 01/15/23 01/15/24  Diamantina Monks, MD  docusate sodium (COLACE) 100 MG capsule Take 1 capsule (100 mg total) by mouth 2 (two) times daily. 01/15/23 01/15/24  Diamantina Monks, MD  ibuprofen (ADVIL) 600 MG tablet Take 1 tablet (600 mg total) by mouth 4 (four) times daily. 01/15/23   Diamantina Monks, MD  methocarbamol (ROBAXIN-750) 750 MG tablet Take 1 tablet (750 mg total) by mouth 4 (four) times daily. 01/15/23   Diamantina Monks, MD  oxyCODONE-acetaminophen (PERCOCET/ROXICET) 5-325 MG tablet Take 1 tablet by mouth every 6 (six) hours as needed. 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   Start date: 11/18/2018   Substances: Nicotine, CBD   Devices: Vape, e-juice  Substance Use Topics   Alcohol use: Not Currently    Comment: 1-2 times per month   Drug use: Yes    Frequency: 3.0 times per week    Types: Marijuana    Comment: none this am     Allergies   Onion   Review of Systems Review of Systems  As per HPI  Physical Exam Triage Vital Signs ED Triage Vitals  Encounter Vitals Group     BP 09/29/23 1050 137/81     Systolic BP Percentile --      Diastolic BP Percentile --      Pulse Rate 09/29/23 1050 (!) 120     Resp  09/29/23 1050 (!) 22     Temp --      Temp src --      SpO2 09/29/23 1050 98 %     Weight --      Height --      Head Circumference --      Peak Flow --      Pain Score 09/29/23 1110 0     Pain Loc --      Pain Education --      Exclude from Growth Chart --    No data found.  Updated Vital Signs BP 137/81   Pulse (!) 114   Resp (!) 22   SpO2 96%    Physical Exam Vitals and nursing note reviewed.  Constitutional:      General: He is in acute distress.  HENT:     Mouth/Throat:     Mouth: Mucous membranes are moist.     Pharynx: Oropharynx is clear.  Eyes:     Conjunctiva/sclera: Conjunctivae normal.     Pupils: Pupils are equal, round, and reactive to light.     Comments: PERRL. Dilated immediately after ativan dose. Normalized after a few seconds. Visual tracking is normal throughout all episodes   Cardiovascular:     Rate and Rhythm: Regular rhythm. Tachycardia present.     Pulses: Normal pulses.     Heart sounds: Normal heart sounds.  Pulmonary:     Effort: Tachypnea present.     Breath sounds: Normal breath sounds.     Comments: Tachypnea alternated with periods of stillness, patient chest rises and falls based on provider hand movement, however externally he appears to not be breathing during the still cycles  Skin:    Coloration: Skin is pale.  Neurological:     Mental Status: He is oriented to person, place, and time. He is confused.     Cranial Nerves: No facial asymmetry.     Motor: Tremor present.     Comments: Full body tremor alternated with periods of stillness and tightened muscles. He can appropriately respond to questions most of the time.     UC Treatments / Results  Labs (all labs ordered are listed, but only abnormal results are displayed) Labs Reviewed  POCT FASTING CBG [facility] MANUAL ENTRY - Abnormal; Notable for the following components:      Result Value   POCT Glucose ([facility]) 118 (*)    All other components within normal limits     EKG   Radiology No results found.  Procedures Procedures (including critical care time)  Medications Ordered in UC Medications  LORazepam (ATIVAN) injection 1 mg (1 mg Intravenous Given 09/29/23 1109)    Initial Impression / Assessment  and Plan / UC Course  I have reviewed the triage vital signs and the nursing notes.  Pertinent labs & imaging results that were available during my care of the patient were reviewed by me and considered in my medical decision making (see chart for details).  Tachycardic and tachypneic  CBG 118  Unable to determine if seizure activity.  Patient is alternating between tremor and stiffness. He is crying out, moaning. While on the floor, another episode of "bucking" and non-verbal response. At this point 1 mg Ativan given through IV In the left AC. [facility] transported to hospital   Final Clinical Impressions(s) / UC Diagnoses   Final diagnoses:  None   Discharge Instructions   None    ED Prescriptions   None    PDMP not reviewed this encounter.   Jaide Hillenburg, Lurena Joiner, [place] 09/29/23 1203

## 2023-09-29 NOTE — Progress Notes (Signed)
As per RN, no room available yet, but pt being moved to room as soon as one is available. Eeg to be done in room as schedule allows

## 2023-09-29 NOTE — Progress Notes (Signed)
As per RN, pt back to baseline; eating, talking behaving normaly. As per [place], eeg to be hooked up in AM due to equipment shortage.

## 2023-09-29 NOTE — Assessment & Plan Note (Signed)
-  has already received 1L of fluid bolus -follow labs in the morning

## 2023-09-29 NOTE — Telephone Encounter (Signed)
Second attempt to reach pt, no voice mailbox.
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
## 2023-09-29 NOTE — Consult Note (Signed)
NEUROLOGY CONSULT NOTE   Date of service: September 29, 2023 Patient Name: Stephen Underwood MRN:  [id number] DOB:  03-13-2001 Chief Complaint: "near syncope" Requesting Provider: Maia Plan, MD  History of Present Illness  Stephen Underwood is a 22 y.o. male  has a past medical history of History of tympanostomy tube placement (2003), Mild to moderate hearing loss, and Seizures (HCC). not on any medications who presents from urgent care for episode of confusion, jerking and shaking movements.  He tells me that in the last 2 weeks he has had 10 episodes of confusion. He states that he has just prior to confusion he develops numbness and pain in his finger tips that then travels through his body down to his feet, followed by confusion. The confusion can last anywhere from minutes to up to 6 hours. There have been 2 times when he had seizure like activity which included full body stiffness and pain. He also states that at times he has blurry vision prior to the episodes.  He states he was recently seen and he was given diazepam and sent home. He states he has a history of epilepsy, does not take nay medications nor follows with a neurologist because they are not severe. He is fearful because his father had a brain tumor at his age and is worried he has one . His story regarding his seizure history and timeline/duration of recent symptoms is very inconsistent and changes during discussion. He states that he was on an anticonvulsant after being diagnosed with epilepsy in his late teens to one examiner, and that he cannot recall what AED he was on. To a second examiner his story changes with him stating that the seizures are new. He states he does not drink any alcohol, he uses benzos and CBD vape. He denies headache currently.   CT head with no acute process. Neurology consulted    ROS   Comprehensive ROS performed and pertinent positives documented in HPI    Past History   Past Medical History:   Diagnosis Date   History of tympanostomy tube placement 2003   Mild to moderate hearing loss    Seizures (HCC)     No past surgical history on file.  Family History: Family History  Problem Relation Age of Onset   Anemia Mother    Depression Mother    Heart murmur Father    ADD / ADHD Father    Epilepsy Father    Peptic Ulcer Father    Bipolar disorder Father    Ovarian cancer Maternal Grandmother    Hypertension Paternal Grandfather    Liver disease Paternal Grandfather    Heart disease Paternal Grandfather     Social History  reports that he has been smoking e-cigarettes. He has quit using smokeless tobacco. He reports that he does not currently use alcohol. He reports current drug use. Frequency: 3.00 times per week. Drug: Marijuana.  Allergies  Allergen Reactions   Onion Swelling    Throat swelling    Medications  No current facility-administered medications for this encounter. No current outpatient medications on file.  Vitals   Vitals:   09/29/23 1144 09/29/23 1510 09/29/23 1533  BP: 104/70 (!) 100/58   Pulse: 71 (!) 113   Resp: 17 16   Temp: (!) 97.5 F (36.4 C)  98.4 F (36.9 C)  TempSrc: Oral  Oral  SpO2: 96% 97%     There is no height or weight on file to calculate BMI.  Physical Exam   Constitutional: Appears well-developed and well-nourished.   Psych: Affect appropriate to situation.   Eyes: No scleral injection.   HENT: No OP obstruction.   Head: Normocephalic.   Cardiovascular: Normal rate and regular rhythm.   Respiratory: Effort normal, non-labored breathing.   GI: Soft.  No distension. There is no tenderness.   Skin: WDI.    Neurologic Examination   Mental Status -  Level of arousal and orientation to time, place, and person were intact. Language including expression, naming, repetition, comprehension was assessed and found intact. Attention span and concentration were normal. Recent and remote memory were intact. Fund of  Knowledge was assessed and was intact.  Cranial Nerves II - XII - II - Visual field intact OU. III, IV, VI - Extraocular movements intact. V - Facial sensation intact bilaterally. VII - Facial movement intact bilaterally. VIII - Hearing & vestibular intact bilaterally. X - Palate elevates symmetrically. XI - Chin turning & shoulder shrug intact bilaterally. XII - Tongue protrusion intact.  Motor Strength - The patient's strength was normal in all extremities and pronator drift was absent.  Bulk was normal and fasciculations were absent.   Motor Tone - Muscle tone was assessed at the neck and appendages and was normal. Sensory - Light touch, temperature/pinprick were assessed and were symmetrical.   Coordination - The patient had normal movements in the hands and feet with no ataxia or dysmetria.  Tremor was absent. Gait and Station - Deferred.  Labs/Imaging/Neurodiagnostic studies   CBC:  Recent Labs  Lab 09-25-23 1919 09-25-23 1921 09/29/23 1156  WBC 5.5  --  9.3  NEUTROABS 3.6  --  7.6  HGB 14.8 14.6 14.2  HCT 43.2 43.0 41.3  MCV 83.9  --  83.8  PLT 191  --  199    Basic Metabolic Panel:  Lab Results  Component Value Date   NA 138 09/29/2023   K 3.4 (L) 09/29/2023   CO2 19 (L) 09/29/2023   GLUCOSE 95 09/29/2023   BUN 8 09/29/2023   CREATININE 0.82 09/29/2023   CALCIUM 8.8 (L) 09/29/2023   GFRNONAA >60 09/29/2023   GFRAA >60 09/20/2019    Lipid Panel: No results found for: "LDLCALC"  HgbA1c: No results found for: "HGBA1C"  Urine Drug Screen:     Component Value Date/Time   LABOPIA NONE DETECTED 09/29/2023 1156   COCAINSCRNUR NONE DETECTED 09/29/2023 1156   COCAINSCRNUR Negative 11/07/2022 1721   LABBENZ POSITIVE (A) 09/29/2023 1156   AMPHETMU NONE DETECTED 09/29/2023 1156   THCU POSITIVE (A) 09/29/2023 1156   LABBARB NONE DETECTED 09/29/2023 1156     Alcohol Level     Component Value Date/Time   ETH <10 09/29/2023 1156    INR  Lab Results   Component Value Date   INR 1.0 11/05/2022    APTT  Lab Results  Component Value Date   APTT 33 11/05/2022    AED levels: No results found for: "PHENYTOIN", "ZONISAMIDE", "LAMOTRIGINE", "LEVETIRACETA"    CT Head without contrast(Personally reviewed): no acute process    Neurodiagnostics vEEG: ordered    Impression   Stephen Underwood is a 22 y.o. male  has a past medical history of History of tympanostomy tube placement (2003), Mild to moderate hearing loss, and Seizures (HCC).  He presents for a stated 2 week history of spells of altered sensorium that he suspects are seizures, in addition to an episode of confusion, jerking and shaking movements.  He tells me that in  the last 2 weeks he has had 10 episodes of confusion. He states that he has just prior to confusion he develops numbness and pain in his finger tips that then travels through his body down to his feet, followed by confusion. DDx includes epilepsy versus psychogenic nonepileptic seizures versus secondary gain.   Recommendations  - LTM overnight  - Inpatient seizure precautions  ______________________________________________________________________    Lockie Mola [facility]  I have seen and examined the patient. I have formulated the assessment and recommendations. 22 year old male with stated history of epilepsy, not on any anticonvulsants recently, THC and benzodiazepine use, presenting with recurrent spells of altered sensorium and one stated episode that also included jerking and shaking movements. Exam is nonfocal. Recommendations as above.  Electronically signed: Dr. Caryl Pina

## 2023-09-29 NOTE — ED Triage Notes (Signed)
Pt. Bib carelink, picked up from urgent care. Pt.was originally dropped off at urgent care by dad for behavioral. Pt. Was reported to having syncope episode. When carelink arrived pt. Reported to start jerking on the floor while alert and oriented X4.  Pt. Felt cool to touch and looks clammy. Pt reports pain all over. V/S normal range. Cbg wdl.

## 2023-09-29 NOTE — Assessment & Plan Note (Signed)
-  hx of UDS positive for cocaine, THC and benzo. He denies any further use other than CBD vapes. UDS today is positive for benzo and THC although he received ativan at urgent care prior to presentation to ED.

## 2023-09-29 NOTE — ED Triage Notes (Addendum)
Pt assessed by Provider in triage for near syncope. Pt began to have jerking body movements and had to be assisted to ground while jerking . Pt alert and talking during jerking episode. Pt remained A/O during stiff muscle alternating with jerking .

## 2023-09-29 NOTE — ED Notes (Signed)
Called [facility] Charge to notify of Pt transfer

## 2023-09-29 NOTE — Telephone Encounter (Signed)
Summary: Dizziness, Blurred Vision, Confusion   Pt is calling in because he went to the hospital a few days ago and was prescribed Valium but he is still having the same symptoms. Pt says he has been diagnosed with anxiety but this isn't anxiety. Pt says he is experiencing dizziness and blurred vision as well as chills and a slight fever. Pt says he has also been experiencing confusion such as not being able to remember where he is.      Attempted to reach pt. On 3 phone numbers -numbers not in service or voice mailbox not set up.

## 2023-09-29 NOTE — ED Provider Notes (Signed)
Patient signed to me by Dr. Jacqulyn Bath pending neurology consultation.  Patient seen by neurology who recommends admission for EEG monitoring   Lorre Nick, MD 09/29/23 1824

## 2023-09-29 NOTE — ED Notes (Signed)
ED TO INPATIENT HANDOFF REPORT  ED Nurse Name and Phone #: Joselyn Glassman [phone])  S Name/Age/Gender Stephen Underwood 22 y.o. male Room/Bed: 043C/043C  Code Status   Code Status: Full Code  Home/SNF/Other Home Patient oriented to: self, place, time, and situation Is this baseline? Yes   Triage Complete: Triage complete  Chief Complaint Seizure-like activity [facility]) [R56.9]  Triage Note Pt. Bib carelink, picked up from urgent care. Pt.was originally dropped off at urgent care by dad for behavioral. Pt. Was reported to having syncope episode. When carelink arrived pt. Reported to start jerking on the floor while alert and oriented X4.  Pt. Felt cool to touch and looks clammy. Pt reports pain all over. V/S normal range. Cbg wdl.    Allergies Allergies  Allergen Reactions   Onion Swelling    Throat swelling    Level of Care/Admitting Diagnosis ED Disposition     ED Disposition  Admit   Condition  --   Comment  Hospital Area: [facility] [[id number]]  Level of Care: Progressive [102]  Admit to Progressive based on following criteria: ACUTE MENTAL DISORDER-RELATED Drug/Alcohol Ingestion/Overdose/Withdrawal, Suicidal Ideation/attempt requiring safety sitter and < Q2h monitoring/assessments, moderate to severe agitation that is managed with medication/sitter, CIWA-Ar score < 20.  May place patient in observation at [facility] or [facility] if equivalent level of care is available:: No  Covid Evaluation: Asymptomatic - no recent exposure (last 10 days) testing not required  Diagnosis: Seizure-like activity [facility]) [[id number]]  Admitting Physician: Anselm Jungling [[id number]]  Attending Physician: Anselm Jungling [[id number]]          B Medical/Surgery History Past Medical History:  Diagnosis Date   History of tympanostomy tube placement 2003   Mild to moderate hearing loss    Seizures (HCC)    History reviewed. No pertinent surgical history.   A IV  Location/Drains/Wounds Patient Lines/Drains/Airways Status     Active Line/Drains/Airways     Name Placement date Placement time Site Days   Peripheral IV 09/29/23 Left Antecubital 09/29/23  1100  Antecubital  less than 1   Wound / Incision (Open or Dehisced) 01/11/23 Laceration Eye Right 01/11/23  0130  Eye  261   Wound / Incision (Open or Dehisced) 01/11/23 Laceration Nose 01/11/23  0130  Nose  261            Intake/Output Last 24 hours No intake or output data in the 24 hours ending 09/29/23 2013  Labs/Imaging Results for orders placed or performed during the hospital encounter of 09/29/23 (from the past 48 hour(s))  Comprehensive metabolic panel     Status: Abnormal   Collection Time: 09/29/23 11:56 AM  Result Value Ref Range   Sodium 138 135 - 145 mmol/L   Potassium 3.4 (L) 3.5 - 5.1 mmol/L   Chloride 109 98 - 111 mmol/L   CO2 19 (L) 22 - 32 mmol/L   Glucose, Bld 95 70 - 99 mg/dL    Comment: Glucose reference range applies only to samples taken after fasting for at least 8 hours.   BUN 8 6 - 20 mg/dL   Creatinine, Ser 6.44 0.61 - 1.24 mg/dL   Calcium 8.8 (L) 8.9 - 10.3 mg/dL   Total Protein 6.6 6.5 - 8.1 g/dL   Albumin 3.8 3.5 - 5.0 g/dL   AST 15 15 - 41 U/L   ALT 14 0 - 44 U/L   Alkaline Phosphatase 61 38 - 126 U/L   Total Bilirubin 1.4 (  H) <1.2 mg/dL   GFR, Estimated >25 >36 mL/min    Comment: (NOTE) Calculated using the CKD-EPI Creatinine Equation (2021)    Anion gap 10 5 - 15    Comment: Performed at [facility], [address], [place], [place] [address]  Lipase, blood     Status: None   Collection Time: 09/29/23 11:56 AM  Result Value Ref Range   Lipase 24 11 - 51 U/L    Comment: Performed at [facility], [address], [place], [place] [address]  CBC with Differential     Status: None   Collection Time: 09/29/23 11:56 AM  Result Value Ref Range   WBC 9.3 4.0 - 10.5 K/uL   RBC 4.93 4.22 - 5.81 MIL/uL   Hemoglobin 14.2 13.0 - 17.0 g/dL    HCT 95.6 38.7 - 56.4 %   MCV 83.8 80.0 - 100.0 fL   MCH 28.8 26.0 - 34.0 pg   MCHC 34.4 30.0 - 36.0 g/dL   RDW 33.2 95.1 - 88.4 %   Platelets 199 150 - 400 K/uL   nRBC 0.0 0.0 - 0.2 %   Neutrophils Relative % 83 %   Neutro Abs 7.6 1.7 - 7.7 K/uL   Lymphocytes Relative 12 %   Lymphs Abs 1.2 0.7 - 4.0 K/uL   Monocytes Relative 5 %   Monocytes Absolute 0.5 0.1 - 1.0 K/uL   Eosinophils Relative 0 %   Eosinophils Absolute 0.0 0.0 - 0.5 K/uL   Basophils Relative 0 %   Basophils Absolute 0.0 0.0 - 0.1 K/uL   Immature Granulocytes 0 %   Abs Immature Granulocytes 0.04 0.00 - 0.07 K/uL    Comment: Performed at [facility], [address], [place], [place] [address]  Troponin I (High Sensitivity)     Status: None   Collection Time: 09/29/23 11:56 AM  Result Value Ref Range   Troponin I (High Sensitivity) <2 <18 ng/L    Comment: (NOTE) Elevated high sensitivity troponin I (hsTnI) values and significant  changes across serial measurements may suggest ACS but many other  chronic and acute conditions are known to elevate hsTnI results.  Refer to the "Links" section for chest pain algorithms and additional  guidance. Performed at [facility], [address] [place], [place] [address]   CK     Status: None   Collection Time: 09/29/23 11:56 AM  Result Value Ref Range   Total CK 89 49 - 397 U/L    Comment: Performed at [facility], [address] [place], [place] [address]  Magnesium     Status: None   Collection Time: 09/29/23 11:56 AM  Result Value Ref Range   Magnesium 1.9 1.7 - 2.4 mg/dL    Comment: Performed at [facility], [address] [place], [place] [address]  TSH     Status: None   Collection Time: 09/29/23 11:56 AM  Result Value Ref Range   TSH 1.430 0.350 - 4.500 uIU/mL    Comment: Performed by a 3rd Generation assay with a functional sensitivity of <=0.01 uIU/mL. Performed at [facility], [address] [place], [place] [address]    Urine rapid drug screen (hosp performed)     Status: Abnormal   Collection Time: 09/29/23 11:56 AM  Result Value Ref Range   Opiates NONE DETECTED NONE DETECTED   Cocaine NONE DETECTED NONE DETECTED   Benzodiazepines POSITIVE (A) NONE DETECTED  Amphetamines NONE DETECTED NONE DETECTED   Tetrahydrocannabinol POSITIVE (A) NONE DETECTED   Barbiturates NONE DETECTED NONE DETECTED    Comment: (NOTE) DRUG SCREEN FOR MEDICAL PURPOSES ONLY.  IF CONFIRMATION IS NEEDED FOR ANY PURPOSE, NOTIFY LAB WITHIN 5 DAYS.  LOWEST DETECTABLE LIMITS FOR URINE DRUG SCREEN Drug Class                     Cutoff (ng/mL) Amphetamine and metabolites    1000 Barbiturate and metabolites    200 Benzodiazepine                 200 Opiates and metabolites        300 Cocaine and metabolites        300 THC                            50 Performed at [facility], [address] [place], [place] [address]   Urinalysis, Routine w reflex microscopic -Urine, Clean Catch     Status: Abnormal   Collection Time: 09/29/23 11:56 AM  Result Value Ref Range   Color, Urine STRAW (A) YELLOW   APPearance CLEAR CLEAR   Specific Gravity, Urine 1.009 1.005 - 1.030   pH 7.0 5.0 - 8.0   Glucose, UA NEGATIVE NEGATIVE mg/dL   Hgb urine dipstick NEGATIVE NEGATIVE   Bilirubin Urine NEGATIVE NEGATIVE   Ketones, ur 20 (A) NEGATIVE mg/dL   Protein, ur NEGATIVE NEGATIVE mg/dL   Nitrite NEGATIVE NEGATIVE   Leukocytes,Ua NEGATIVE NEGATIVE    Comment: Performed at [facility], [address] [place], [place] [address]  Ethanol     Status: None   Collection Time: 09/29/23 11:56 AM  Result Value Ref Range   Alcohol, Ethyl (B) <10 <10 mg/dL    Comment: (NOTE) Lowest detectable limit for serum alcohol is 10 mg/dL.  For medical purposes only. Performed at [facility], [address], [place], [place] [address]   Troponin I (High Sensitivity)     Status: None   Collection Time: 09/29/23  3:05 PM  Result Value  Ref Range   Troponin I (High Sensitivity) <2 <18 ng/L    Comment: (NOTE) Elevated high sensitivity troponin I (hsTnI) values and significant  changes across serial measurements may suggest ACS but many other  chronic and acute conditions are known to elevate hsTnI results.  Refer to the "Links" section for chest pain algorithms and additional  guidance. Performed at [facility], [address], [place], [place] [address]    CT Head Wo Contrast  Result Date: 09/29/2023 CLINICAL DATA:  Syncopal episode. Altered mental status. Possible seizure activity. EXAM: CT HEAD WITHOUT CONTRAST TECHNIQUE: Contiguous axial images were obtained from the base of the skull through the vertex without intravenous contrast. RADIATION DOSE REDUCTION: This exam was performed according to the departmental dose-optimization program which includes automated exposure control, adjustment of the mA and/or kV according to patient size and/or use of iterative reconstruction technique. COMPARISON:  01/10/2023 FINDINGS: Brain: No evidence of intracranial hemorrhage, acute infarction, hydrocephalus, extra-axial collection, or mass lesion/mass effect. Vascular:  No hyperdense vessel or other acute findings. Skull: No evidence of fracture or other significant bone abnormality. Sinuses/Orbits:  No acute findings. Other: None. IMPRESSION: Negative noncontrast head CT. Electronically Signed   By: Danae Orleans M.D.   On: 09/29/2023 16:25   CT ABDOMEN PELVIS W CONTRAST  Result Date:  09/29/2023 CLINICAL DATA:  Acute abdominal pain.  Syncopal episode. EXAM: CT ABDOMEN AND PELVIS WITH CONTRAST TECHNIQUE: Multidetector CT imaging of the abdomen and pelvis was performed using the standard protocol following bolus administration of intravenous contrast. RADIATION DOSE REDUCTION: This exam was performed according to the departmental dose-optimization program which includes automated exposure control, adjustment of the mA and/or kV  according to patient size and/or use of iterative reconstruction technique. CONTRAST:  75mL OMNIPAQUE IOHEXOL 350 MG/ML SOLN COMPARISON:  01/10/2023 FINDINGS: Lower Chest: No acute findings. Hepatobiliary: No suspicious hepatic masses or other lesions identified. Gallbladder is unremarkable. No evidence of biliary ductal dilatation. Pancreas:  No mass or inflammatory changes. Spleen: Within normal limits in size and appearance. Adrenals/Urinary Tract: No suspicious masses identified. No evidence of ureteral calculi or hydronephrosis. Stomach/Bowel: No evidence of obstruction, inflammatory process or abnormal fluid collections. Vascular/Lymphatic: No pathologically enlarged lymph nodes. No acute vascular findings. Reproductive:  No mass or other significant abnormality. Other:  None. Musculoskeletal:  No suspicious bone lesions identified. IMPRESSION: No acute findings or other significant abnormality. Electronically Signed   By: Danae Orleans M.D.   On: 09/29/2023 16:24    Pending Labs Unresulted Labs (From admission, onward)    None       Vitals/Pain Today's Vitals   09/29/23 1510 09/29/23 1533 09/29/23 2000 09/29/23 2008  BP: (!) 100/58  (!) 106/95   Pulse: (!) 113  62   Resp: 16     Temp:  98.4 F (36.9 C)  98.2 F (36.8 C)  TempSrc:  Oral    SpO2: 97%  100%   PainSc:        Isolation Precautions No active isolations  Medications Medications  Oral care mouth rinse (has no administration in time range)  Oral care mouth rinse (has no administration in time range)  LORazepam (ATIVAN) injection 1 mg (has no administration in time range)  naproxen (NAPROSYN) tablet 375 mg (has no administration in time range)  influenza vac split trivalent PF (FLULAVAL) injection 0.5 mL (has no administration in time range)  sodium chloride 0.9 % bolus 1,000 mL (0 mLs Intravenous Stopped 09/29/23 1408)  ketorolac (TORADOL) 30 MG/ML injection 30 mg (30 mg Intravenous Given 09/29/23 1250)   prochlorperazine (COMPAZINE) injection 5 mg (5 mg Intravenous Given 09/29/23 1250)  iohexol (OMNIPAQUE) 350 MG/ML injection 75 mL (75 mLs Intravenous Contrast Given 09/29/23 1346)    Mobility walks     Focused Assessments    R Recommendations: See Admitting Provider Note  Report given to:   Additional Notes:

## 2023-09-30 ENCOUNTER — Observation Stay (HOSPITAL_COMMUNITY): Payer: Federal, State, Local not specified - PPO

## 2023-09-30 ENCOUNTER — Ambulatory Visit: Payer: Self-pay

## 2023-09-30 DIAGNOSIS — Z820 Family history of epilepsy and other diseases of the nervous system: Secondary | ICD-10-CM | POA: Diagnosis not present

## 2023-09-30 DIAGNOSIS — Z9109 Other allergy status, other than to drugs and biological substances: Secondary | ICD-10-CM | POA: Diagnosis not present

## 2023-09-30 DIAGNOSIS — F329 Major depressive disorder, single episode, unspecified: Secondary | ICD-10-CM | POA: Diagnosis not present

## 2023-09-30 DIAGNOSIS — R55 Syncope and collapse: Secondary | ICD-10-CM | POA: Diagnosis present

## 2023-09-30 DIAGNOSIS — F17290 Nicotine dependence, other tobacco product, uncomplicated: Secondary | ICD-10-CM | POA: Diagnosis present

## 2023-09-30 DIAGNOSIS — Z23 Encounter for immunization: Secondary | ICD-10-CM | POA: Diagnosis not present

## 2023-09-30 DIAGNOSIS — Z5982 Transportation insecurity: Secondary | ICD-10-CM | POA: Diagnosis not present

## 2023-09-30 DIAGNOSIS — F419 Anxiety disorder, unspecified: Secondary | ICD-10-CM | POA: Diagnosis not present

## 2023-09-30 DIAGNOSIS — R1010 Upper abdominal pain, unspecified: Secondary | ICD-10-CM | POA: Diagnosis present

## 2023-09-30 DIAGNOSIS — E876 Hypokalemia: Secondary | ICD-10-CM | POA: Diagnosis present

## 2023-09-30 DIAGNOSIS — Z9151 Personal history of suicidal behavior: Secondary | ICD-10-CM | POA: Diagnosis not present

## 2023-09-30 DIAGNOSIS — F1910 Other psychoactive substance abuse, uncomplicated: Secondary | ICD-10-CM | POA: Diagnosis not present

## 2023-09-30 DIAGNOSIS — R569 Unspecified convulsions: Secondary | ICD-10-CM | POA: Diagnosis not present

## 2023-09-30 DIAGNOSIS — Z818 Family history of other mental and behavioral disorders: Secondary | ICD-10-CM | POA: Diagnosis not present

## 2023-09-30 DIAGNOSIS — H9190 Unspecified hearing loss, unspecified ear: Secondary | ICD-10-CM | POA: Diagnosis present

## 2023-09-30 DIAGNOSIS — F1310 Sedative, hypnotic or anxiolytic abuse, uncomplicated: Secondary | ICD-10-CM | POA: Diagnosis present

## 2023-09-30 DIAGNOSIS — F1210 Cannabis abuse, uncomplicated: Secondary | ICD-10-CM | POA: Diagnosis present

## 2023-09-30 DIAGNOSIS — Z8249 Family history of ischemic heart disease and other diseases of the circulatory system: Secondary | ICD-10-CM | POA: Diagnosis not present

## 2023-09-30 DIAGNOSIS — E119 Type 2 diabetes mellitus without complications: Secondary | ICD-10-CM | POA: Diagnosis present

## 2023-09-30 DIAGNOSIS — Z8041 Family history of malignant neoplasm of ovary: Secondary | ICD-10-CM | POA: Diagnosis not present

## 2023-09-30 DIAGNOSIS — E872 Acidosis, unspecified: Secondary | ICD-10-CM | POA: Diagnosis present

## 2023-09-30 DIAGNOSIS — R197 Diarrhea, unspecified: Secondary | ICD-10-CM | POA: Diagnosis present

## 2023-09-30 LAB — BASIC METABOLIC PANEL
Anion gap: 11 (ref 5–15)
BUN: 6 mg/dL (ref 6–20)
CO2: 24 mmol/L (ref 22–32)
Calcium: 9.3 mg/dL (ref 8.9–10.3)
Chloride: 105 mmol/L (ref 98–111)
Creatinine, Ser: 0.97 mg/dL (ref 0.61–1.24)
GFR, Estimated: >60 mL/min (ref >60)
Glucose, Bld: 88 mg/dL (ref 70–99)
Potassium: 3.9 mmol/L (ref 3.5–5.1)
Sodium: 140 mmol/L (ref 135–145)

## 2023-09-30 MED ORDER — MIDAZOLAM HCL 2 MG/2ML IJ SOLN
0.5000 mg | INTRAMUSCULAR | Status: DC | PRN
Start: 2023-09-30 — End: 2023-10-01

## 2023-09-30 NOTE — Progress Notes (Signed)
LTM EEG hooked up and running - no initial skin breakdown - push button tested - [facility] monitoring. GMD/NA 

## 2023-09-30 NOTE — Telephone Encounter (Signed)
Copied from CRM [id number]. Topic: Clinical - Red Word Triage >> Sep 30, 2023  3:02 PM Deaijah H wrote: Red Word that prompted transfer to Nurse Triage: Seizures   Chief Complaint: New Patient Appointment  Disposition: [] ED /[] Urgent Care (no appt availability in office) / [x] Appointment(In office/virtual)/ []  [facility]/ [] Home Care/ [] Refused Recommended Disposition /[] [facility]/ []  Follow-up with PCP Additional Notes: Case Management from [facility] called to establish New Patient Appointment for patient.

## 2023-09-30 NOTE — ED Notes (Signed)
This RN received call from EEG tech.  Per tech, neurology team ok with deferring EEG until morning since pt appears to be at baseline due to volume of EEG's currently needing to be done.

## 2023-09-30 NOTE — Plan of Care (Signed)
No acute event overnight, pt has been resting overnight.  Problem: Education: Goal: Knowledge of General Education information will improve Description: Including pain rating scale, medication(s)/side effects and non-pharmacologic comfort measures Outcome: Progressing   Problem: Health Behavior/Discharge Planning: Goal: Ability to manage health-related needs will improve Outcome: Progressing   Problem: Clinical Measurements: Goal: Ability to maintain clinical measurements within normal limits will improve Outcome: Progressing Goal: Will remain free from infection Outcome: Progressing Goal: Diagnostic test results will improve Outcome: Progressing Goal: Respiratory complications will improve Outcome: Progressing Goal: Cardiovascular complication will be avoided Outcome: Progressing   Problem: Activity: Goal: Risk for activity intolerance will decrease Outcome: Progressing   Problem: Nutrition: Goal: Adequate nutrition will be maintained Outcome: Progressing   Problem: Coping: Goal: Level of anxiety will decrease Outcome: Progressing   Problem: Elimination: Goal: Will not experience complications related to bowel motility Outcome: Progressing Goal: Will not experience complications related to urinary retention Outcome: Progressing   Problem: Pain Management: Goal: General experience of comfort will improve Outcome: Progressing   Problem: Safety: Goal: Ability to remain free from injury will improve Outcome: Progressing   Problem: Skin Integrity: Goal: Risk for impaired skin integrity will decrease Outcome: Progressing

## 2023-09-30 NOTE — Progress Notes (Signed)
PROGRESS NOTE    Stephen Underwood  ONG:[id number] DOB: [birth year] DOA: 09/29/2023 PCP: Pcp, No   Brief Narrative: Stephen Underwood is a 22 y.o. male with a history of pseudoseizures, polysubstance abuse, depression, anxiety.  Patient presented secondary to near syncope with concern for seizure-like activity. Neurology consulted and patient placed on EEG monitoring.   Assessment and Plan:  Seizure-like activity History of pseudoseizures with negative video EEG in 2023. This admission, patient was found to feel "spacey" with intermittent confusion and word finding difficulties, in addition to extremity stiffness. Neurology consulted and video EEG started. Psychiatry also consulted for evaluation -Neurology recommendations: continue EEG  Metabolic acidosis Mild on admission. Resolved with IV fluids.  Hypokalemia Mild on admission. Resolved without supplementation.  Polysubstance abuse Noted. UDS was positive for benzodiazepines and THC.   DVT prophylaxis: SCDs Code Status:   Code Status: Full Code Family Communication: None at bedside Disposition Plan: Discharge home likely in 24 hours pending ongoing neurology recommendations   Consultants:  Neurology  Procedures:  EEG  Antimicrobials: None    Subjective: Some left upper abdominal pain. No other concerns.  Objective: BP 115/68 (BP Location: Right Arm)   Pulse 63   Temp 97.6 F (36.4 C) (Oral)   Resp 18   Ht 5\' 8"  (1.727 m)   Wt 70.3 kg   SpO2 98%   BMI 23.57 kg/m   Examination:  General exam: Appears calm and comfortable Respiratory system: Clear to auscultation. Respiratory effort normal. Cardiovascular system: S1 & S2 heard, RRR. No murmurs. Gastrointestinal system: Abdomen is nondistended, soft and nontender. Normal bowel sounds heard. Central nervous system: Alert and oriented. No focal neurological deficits. Musculoskeletal: No edema. No calf tenderness Psychiatry: Judgement and insight appear  normal. Mood & affect appropriate.    Data Reviewed: I have personally reviewed following labs and imaging studies  CBC Lab Results  Component Value Date   WBC 9.3 09/29/2023   RBC 4.93 09/29/2023   HGB 14.2 09/29/2023   HCT 41.3 09/29/2023   MCV 83.8 09/29/2023   MCH 28.8 09/29/2023   PLT 199 09/29/2023   MCHC 34.4 09/29/2023   RDW 12.5 09/29/2023   LYMPHSABS 1.2 09/29/2023   MONOABS 0.5 09/29/2023   EOSABS 0.0 09/29/2023   BASOSABS 0.0 09/29/2023     Last metabolic panel Lab Results  Component Value Date   NA 140 09/30/2023   K 3.9 09/30/2023   CL 105 09/30/2023   CO2 24 09/30/2023   BUN 6 09/30/2023   CREATININE 0.97 09/30/2023   GLUCOSE 88 09/30/2023   GFRNONAA >60 09/30/2023   GFRAA >60 09/20/2019   CALCIUM 9.3 09/30/2023   PHOS 1.9 (L) 09/24/2023   PROT 6.6 09/29/2023   ALBUMIN 3.8 09/29/2023   BILITOT 1.4 (H) 09/29/2023   ALKPHOS 61 09/29/2023   AST 15 09/29/2023   ALT 14 09/29/2023   ANIONGAP 11 09/30/2023    GFR: Estimated Creatinine Clearance: 115.6 mL/min (by C-G formula based on SCr of 0.97 mg/dL).  No results found for this or any previous visit (from the past 240 hour(s)).    Radiology Studies: CT Head Wo Contrast  Result Date: 09/29/2023 CLINICAL DATA:  Syncopal episode. Altered mental status. Possible seizure activity. EXAM: CT HEAD WITHOUT CONTRAST TECHNIQUE: Contiguous axial images were obtained from the base of the skull through the vertex without intravenous contrast. RADIATION DOSE REDUCTION: This exam was performed according to the departmental dose-optimization program which includes automated exposure control, adjustment of the mA and/or  kV according to patient size and/or use of iterative reconstruction technique. COMPARISON:  01/10/2023 FINDINGS: Brain: No evidence of intracranial hemorrhage, acute infarction, hydrocephalus, extra-axial collection, or mass lesion/mass effect. Vascular:  No hyperdense vessel or other acute findings.  Skull: No evidence of fracture or other significant bone abnormality. Sinuses/Orbits:  No acute findings. Other: None. IMPRESSION: Negative noncontrast head CT. Electronically Signed   By: Danae Orleans M.D.   On: 09/29/2023 16:25   CT ABDOMEN PELVIS W CONTRAST  Result Date: 09/29/2023 CLINICAL DATA:  Acute abdominal pain.  Syncopal episode. EXAM: CT ABDOMEN AND PELVIS WITH CONTRAST TECHNIQUE: Multidetector CT imaging of the abdomen and pelvis was performed using the standard protocol following bolus administration of intravenous contrast. RADIATION DOSE REDUCTION: This exam was performed according to the departmental dose-optimization program which includes automated exposure control, adjustment of the mA and/or kV according to patient size and/or use of iterative reconstruction technique. CONTRAST:  75mL OMNIPAQUE IOHEXOL 350 MG/ML SOLN COMPARISON:  01/10/2023 FINDINGS: Lower Chest: No acute findings. Hepatobiliary: No suspicious hepatic masses or other lesions identified. Gallbladder is unremarkable. No evidence of biliary ductal dilatation. Pancreas:  No mass or inflammatory changes. Spleen: Within normal limits in size and appearance. Adrenals/Urinary Tract: No suspicious masses identified. No evidence of ureteral calculi or hydronephrosis. Stomach/Bowel: No evidence of obstruction, inflammatory process or abnormal fluid collections. Vascular/Lymphatic: No pathologically enlarged lymph nodes. No acute vascular findings. Reproductive:  No mass or other significant abnormality. Other:  None. Musculoskeletal:  No suspicious bone lesions identified. IMPRESSION: No acute findings or other significant abnormality. Electronically Signed   By: Danae Orleans M.D.   On: 09/29/2023 16:24      LOS: 0 days    Jacquelin Hawking, MD [facility] 09/30/2023, 2:09 PM   If 7PM-7AM, please contact night-coverage [email or web address]
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
## 2023-09-30 NOTE — Progress Notes (Signed)
Subjective: States he does not use benzodiazepines however does use cannabis.  However about 2 weeks ago when he started feeling hazy "like he is in a dreamlike state", he stopped using cannabis.  ROS: negative except above  Examination  Vital signs in last 24 hours: Temp:  [97.5 F (36.4 C)-98.4 F (36.9 C)] 97.6 F (36.4 C) (11/12 0758) Pulse Rate:  [51-120] 63 (11/12 0758) Resp:  [16-22] 18 (11/12 0758) BP: (100-137)/(58-95) 115/84 (11/12 0758) SpO2:  [96 %-100 %] 98 % (11/12 0758)  General: lying in bed, NAD  Neuro: MS: Alert, oriented, follows commands CN: pupils equal and reactive,  EOMI, face symmetric, tongue midline, normal sensation over face, Motor: 5/5 strength in all 4 extremities Coordination: normal Gait: not tested  Basic Metabolic Panel: Recent Labs  Lab 09/24/23 1919 09/24/23 1921 09/29/23 1156  NA 137 140 138  K 3.7 3.6 3.4*  CL 102 102 109  CO2 23  --  19*  GLUCOSE 99 101* 95  BUN 8 8 8   CREATININE 0.92 1.00 0.82  CALCIUM 9.3  --  8.8*  MG 2.0  --  1.9  PHOS 1.9*  --   --     CBC: Recent Labs  Lab 09/24/23 1919 09/24/23 1921 09/29/23 1156  WBC 5.5  --  9.3  NEUTROABS 3.6  --  7.6  HGB 14.8 14.6 14.2  HCT 43.2 43.0 41.3  MCV 83.9  --  83.8  PLT 191  --  199     Coagulation Studies: No results for input(s): "LABPROT", "INR" in the last 72 hours.  Imaging CT head without contrast 09/29/2023: No acute abnormality.  MRI brain without contrast 11/06/2022: Normal MRI brain  ASSESSMENT AND PLAN: 22 year old male with seizure-like activity in the setting of benzodiazepine use and THC use.  Seizure-like activity Cannabis use disorder -Differentials include seizures due to substance use versus nonepileptic events versus epilepsy. -Of note per chart review patient was seen in December 2023 due to intentional overdose and suicide attempt leading to seizure-like episodes which were recorded on video EEG and were  nonepileptic.  Recommendations -start video EEG monitoring for characterization of spells -Will perform hyperventilation and photic stimulation for spell provocation -Will also recommend sleep deprivation -Will not start on any antiseizure medications unless we see definite ictal-interictal abnormality on EEG -Counseled against substance use -Discussed seizure precautions including do not drive -Management of rest of comorbidities per primary team  I have spent a total of   36 minutes with the patient reviewing hospital notes,  test results, labs and examining the patient as well as establishing an assessment and plan that was discussed personally with the patient.  > 50% of time was spent in direct patient care.    Lindie Spruce Epilepsy [facility] For questions after 5pm please refer to AMION to reach the Neurologist on call

## 2023-09-30 NOTE — Plan of Care (Signed)
Pt alert and oriented x 4. IV is patent and saline locked. Continent of bowel and bladder. Continues EEG. Complains of headache due to EEG placement, see MAR. Will continue with current plan of care.  Problem: Education: Goal: Knowledge of General Education information will improve Description: Including pain rating scale, medication(s)/side effects and non-pharmacologic comfort measures Outcome: Progressing   Problem: Health Behavior/Discharge Planning: Goal: Ability to manage health-related needs will improve Outcome: Progressing   Problem: Clinical Measurements: Goal: Ability to maintain clinical measurements within normal limits will improve Outcome: Progressing Goal: Will remain free from infection Outcome: Progressing Goal: Diagnostic test results will improve Outcome: Progressing Goal: Respiratory complications will improve Outcome: Progressing Goal: Cardiovascular complication will be avoided Outcome: Progressing   Problem: Activity: Goal: Risk for activity intolerance will decrease Outcome: Progressing   Problem: Nutrition: Goal: Adequate nutrition will be maintained Outcome: Progressing   Problem: Coping: Goal: Level of anxiety will decrease Outcome: Progressing   Problem: Elimination: Goal: Will not experience complications related to bowel motility Outcome: Progressing Goal: Will not experience complications related to urinary retention Outcome: Progressing   Problem: Pain Management: Goal: General experience of comfort will improve Outcome: Progressing   Problem: Safety: Goal: Ability to remain free from injury will improve Outcome: Progressing   Problem: Skin Integrity: Goal: Risk for impaired skin integrity will decrease Outcome: Progressing

## 2023-09-30 NOTE — TOC Initial Note (Signed)
Transition of Care [facility]) - Initial/Assessment Note    Patient Details  Name: Stephen Underwood MRN: [id number] Date of Birth: 2001/07/17  [facility] CM/SW Contact:    Kermit Balo, RN Phone Number: 09/30/2023, 3:15 PM  Clinical Narrative:                  Pt is from home with his parents. He denies issues with transportation. He states he doesn't take any prescription medications.  Dr Durwin Nora listed for PCP. Pt states his last MD was at [facility].  CM was able to get appt at [facility] in [place] (Dr Durwin Nora) office. Information on the AVS. Pt has transportation home at d/c.  TOC following.  Expected Discharge Plan: Home/Self Care Barriers to Discharge: Continued Medical Work up   Patient Goals and CMS Choice            Expected Discharge Plan and Services       Living arrangements for the past 2 months: Single Family Home                                      Prior Living Arrangements/Services Living arrangements for the past 2 months: Single Family Home Lives with:: Parents Patient language and need for interpreter reviewed:: Yes Do you feel safe going back to the place where you live?: Yes            Criminal Activity/Legal Involvement Pertinent to Current Situation/Hospitalization: No - Comment as needed  Activities of Daily Living   ADL Screening (condition at time of admission) Independently performs ADLs?: Yes (appropriate for developmental age) Is the patient deaf or have difficulty hearing?: No Does the patient have difficulty seeing, even when wearing glasses/contacts?: No Does the patient have difficulty concentrating, remembering, or making decisions?: Yes  Permission Sought/Granted                  Emotional Assessment Appearance:: Appears stated age Attitude/Demeanor/Rapport: Engaged Affect (typically observed): Accepting Orientation: : Oriented to Self, Oriented to Place, Oriented to  Time, Oriented to  Situation   Psych Involvement: No (comment)  Admission diagnosis:  Seizure-like activity (HCC) [R56.9] Patient Active Problem List   Diagnosis Date Noted   Polysubstance abuse (HCC) 09/29/2023   Seizure-like activity (HCC) 09/29/2023   Metabolic acidosis 09/29/2023   Hip dislocation, left, initial encounter (HCC) 01/11/2023   Liver laceration, grade III, with open wound into cavity, initial encounter 01/10/2023   MDD (major depressive disorder), recurrent episode, severe (HCC) 11/08/2022   Status epilepticus (HCC) 11/05/2022   Chronic respiratory failure with hypoxia (HCC) 11/05/2022   Moderate malnutrition (HCC) 09/21/2019   Poor appetite 09/21/2019   Weight loss 09/20/2019   Vomiting 09/20/2019   Vision changes 09/20/2019   PCP:  Billie Lade, MD Pharmacy:   [phone] - [place], [place] - [address] AT [facility] [address] [place] [address] Phone: [phone] Fax: [phone]  [facility] [address] [place] [address] Phone: [phone] Fax: [phone]  [id number] - [place], [place] - [address] AT [facility] [address] [place] [address] Phone: [phone] Fax: [phone]     Social Determinants of Health (SDOH) Social History: SDOH Screenings   Food Insecurity: No Food Insecurity (09/29/2023)  Housing: Medium Risk (09/29/2023)  Transportation Needs: Unmet Transportation  Needs (09/29/2023)  Utilities: Not At Risk (09/29/2023)  Alcohol Screen: Low Risk  (11/08/2022)  Financial Resource Strain: Low Risk  (09/21/2019)  Physical Activity: Insufficiently Active (09/21/2019)  Social Connections: Socially Isolated (09/21/2019)  Stress: No Stress Concern Present (09/21/2019)  Tobacco Use: High Risk (09/29/2023)   SDOH Interventions:     Readmission Risk Interventions     No data to display

## 2023-09-30 NOTE — Hospital Course (Signed)
Stephen Underwood is a 22 y.o. male with a history of pseudoseizures, polysubstance abuse, depression, anxiety.  Patient presented secondary to near syncope with concern for seizure-like activity. Neurology consulted and patient placed on EEG monitoring.

## 2023-09-30 NOTE — Progress Notes (Signed)
Maint done, no skin breakdown

## 2023-10-01 ENCOUNTER — Other Ambulatory Visit: Payer: Self-pay | Admitting: Family Medicine

## 2023-10-01 ENCOUNTER — Telehealth: Payer: Self-pay | Admitting: Nurse Practitioner

## 2023-10-01 DIAGNOSIS — R569 Unspecified convulsions: Secondary | ICD-10-CM | POA: Diagnosis not present

## 2023-10-01 NOTE — Progress Notes (Addendum)
Subjective: Felt confused after waking up yesterday without any EEG change.  States he is feeling better today.  Denies any concerns.  ROS: negative except above  Examination  Vital signs in last 24 hours: Temp:  [97.6 F (36.4 C)-98.2 F (36.8 C)] 97.8 F (36.6 C) (11/13 0721) Pulse Rate:  [43-78] 60 (11/13 0721) Resp:  [17-18] 17 (11/13 0721) BP: (96-117)/(63-84) 96/63 (11/13 0721) SpO2:  [97 %-99 %] 98 % (11/13 0721)  General: lying in bed, NAD   Neuro: MS: Alert, oriented, follows commands CN: pupils equal and reactive,  EOMI, face symmetric, tongue midline, normal sensation over face, Motor: 5/5 strength in all 4 extremities Coordination: normal Gait: not tested  Basic Metabolic Panel: Recent Labs  Lab 09/24/23 1919 09/24/23 1921 09/29/23 1156 09/30/23 1107  NA 137 140 138 140  K 3.7 3.6 3.4* 3.9  CL 102 102 109 105  CO2 23  --  19* 24  GLUCOSE 99 101* 95 88  BUN 8 8 8 6   CREATININE 0.92 1.00 0.82 0.97  CALCIUM 9.3  --  8.8* 9.3  MG 2.0  --  1.9  --   PHOS 1.9*  --   --   --     CBC: Recent Labs  Lab 09/24/23 1919 09/24/23 1921 09/29/23 1156  WBC 5.5  --  9.3  NEUTROABS 3.6  --  7.6  HGB 14.8 14.6 14.2  HCT 43.2 43.0 41.3  MCV 83.9  --  83.8  PLT 191  --  199     Coagulation Studies: No results for input(s): "LABPROT", "INR" in the last 72 hours.  Imaging No new brain imaging overnight.  ASSESSMENT AND PLAN: 22 year old male with seizure-like activity in the setting of benzodiazepine use and THC use.   Seizure-like activity Cannabis use disorder Mental fogginess -Differentials include seizures due to substance use versus nonepileptic events versus epilepsy. -Of note per chart review patient was seen in December 2023 due to intentional overdose and suicide attempt leading to seizure-like episodes which were recorded on video EEG and were nonepileptic.   Recommendations -HV and photic stimulation today.  If normal, recommend discontinuing  LTM EEG Plan discussed diagnosis of nonepileptic spells with patient -Also discussed neuropsych evaluation if fogginess persists after patient has been off of any kind of substances for more than a month -Counseled against substance use -Discussed seizure precautions including do not drive (states he does not have drivers license) -Discussed plan with Dr. Lowell Guitar via secure chat  Seizure precautions: Per [facility] statutes, patients with seizures are not allowed to drive until they have been seizure-free for six months and cleared by a physician    Use caution when using heavy equipment or power tools. Avoid working on ladders or at heights. Take showers instead of baths. Ensure the water temperature is not too high on the home water heater. Do not go swimming alone. Do not lock yourself in a room alone (i.e. bathroom). When caring for infants or small children, sit down when holding, feeding, or changing them to minimize risk of injury to the child in the event you have a seizure. Maintain good sleep hygiene. Avoid alcohol.    If patient has another seizure, call 911 and bring them back to the ED if: A.  The seizure lasts longer than 5 minutes.      B.  The patient doesn't wake shortly after the seizure or has new problems such as difficulty seeing, speaking or moving following the seizure  C.  The patient was injured during the seizure D.  The patient has a temperature over 102 F (39C) E.  The patient vomited during the seizure and now is having trouble breathing    During the Seizure   - First, ensure adequate ventilation and place patients on the floor on their left side  Loosen clothing around the neck and ensure the airway is patent. If the patient is clenching the teeth, do not force the mouth open with any object as this can cause severe damage - Remove all items from the surrounding that can be hazardous. The patient may be oblivious to what's happening and may not even know  what he or she is doing. If the patient is confused and wandering, either gently guide him/her away and block access to outside areas - Reassure the individual and be comforting - Call 911. In most cases, the seizure ends before EMS arrives. However, there are cases when seizures may last over 3 to 5 minutes. Or the individual may have developed breathing difficulties or severe injuries. If a pregnant patient or a person with diabetes develops a seizure, it is prudent to call an ambulance.    After the Seizure (Postictal Stage)   After a seizure, most patients experience confusion, fatigue, muscle pain and/or a headache. Thus, one should permit the individual to sleep. For the next few days, reassurance is essential. Being calm and helping reorient the person is also of importance.   Most seizures are painless and end spontaneously. Seizures are not harmful to others but can lead to complications such as stress on the lungs, brain and the heart. Individuals with prior lung problems may develop labored breathing and respiratory distress.   I have spent a total of   35 minutes with the patient reviewing hospital notes,  test results, labs and examining the patient as well as establishing an assessment and plan that was discussed personally with the patient.  > 50% of time was spent in direct patient care.     Lindie Spruce Epilepsy [facility] For questions after 5pm please refer to AMION to reach the Neurologist on call

## 2023-10-01 NOTE — Discharge Summary (Signed)
Physician Discharge Summary  EUGENE SHADOWENS VWU:[id number] DOB: [birth year] DOA: 09/29/2023  PCP: No primary care provider on file.  Admit date: 09/29/2023 Discharge date: 10/01/2023  Time spent: 40 minutes  Recommendations for Outpatient Follow-up:  Follow outpatient CBC/CMP  Follow with PCP outpatient Neuropsych evaluation if continued symptoms after abstinence for 1 month from illicit substances Intensive outpatient program recommended by psych   Discharge Diagnoses:  Principal Problem:   Seizure-like activity (HCC) Active Problems:   Polysubstance abuse (HCC)   Metabolic acidosis   Discharge Condition: stable  Diet recommendation: heart healthy   Filed Weights   09/30/23 0900  Weight: 70.3 kg    History of present illness:   Stephen Underwood is Veralyn Lopp 22 y.o. male with Dula Havlik history of pseudoseizures, polysubstance abuse, depression, anxiety. Patient presented secondary to near syncope with concern for seizure-like activity. Neurology consulted and patient placed on EEG monitoring.   Overnight EEG without seizures or epileptiform discharges.  Stable for discharge today, 11/13.   See below for additional details.   Hospital Course:  Assessment and Plan:  Seizure-like activity History of pseudoseizures with negative video EEG in 2023. This admission, patient was found to feel "spacey" with intermittent confusion and word finding difficulties, in addition to stiffness in his extremities. Neurology consulted and video EEG started. Overnight EEG was without seizures or epileptiform discharges.  Neuro recommended seizure precautions and neuropsych evaluation if continued fogginess after 1 month abstinence.  Psychiatry also consulted for evaluation -> recommending IOP referral, no need for inpatient psych admission.    Metabolic acidosis resolved   Hypokalemia improved   Polysubstance abuse Noted. UDS was positive for benzodiazepines and THC. Encourage  cessation  Diarrhea Noted today x2, improved prior to discharge - will need to watch post discharge    Procedures: EEG IMPRESSION: This study is within normal limits. No seizures or epileptiform discharges were seen throughout the recording.   Event button was pressed on 09/30/2023 at 1003. Patient reported feeling confused without concomitant EEG change. This was most likely NOT an epileptic event.    Achillies Buehl normal interictal EEG does not exclude the diagnosis of epilepsy.   Consultations: neurology  Discharge Exam: Vitals:   10/01/23 0721 10/01/23 1127  BP: 96/63 114/89  Pulse: 60 79  Resp: 17 18  Temp: 97.8 F (36.6 C) 98.2 F (36.8 C)  SpO2: 98% 99%   Feeling better Less foggy Some diarrhea/loose stool this morning  General: No acute distress. Cardiovascular: RRR Lungs: unlabored Abdomen: Soft, nontender, nondistended Neurological: Alert and oriented 3. Moves all extremities 4 with equal strength. Cranial nerves II through XII grossly intact. Extremities: No clubbing or cyanosis. No edema.  Discharge Instructions   Discharge Instructions     Call MD for:  difficulty breathing, headache or visual disturbances   Complete by: As directed    Call MD for:  extreme fatigue   Complete by: As directed    Call MD for:  hives   Complete by: As directed    Call MD for:  persistant dizziness or light-headedness   Complete by: As directed    Call MD for:  persistant nausea and vomiting   Complete by: As directed    Call MD for:  redness, tenderness, or signs of infection (pain, swelling, redness, odor or green/yellow discharge around incision site)   Complete by: As directed    Call MD for:  severe uncontrolled pain   Complete by: As directed    Call MD for:  temperature >  100.4   Complete by: As directed    Diet - low sodium heart healthy   Complete by: As directed    Discharge instructions   Complete by: As directed    You were seen for concern for seizure like  activity.   You had an EEG that did not show evidence of seizure activity.   You've been seen by neurology who recommend avoiding cannabis and other illicit drugs.    If the fogginess that you have persists, you should follow up with neuropsych for further evaluation.  Per [facility] statutes, patients with seizures are not allowed to drive until  they have been seizure-free for six months. Use caution when using heavy equipment or power tools. Avoid working on ladders or at heights. Take showers instead of baths. Ensure the water temperature is not too high on the home water heater. Do not go swimming alone. When caring for infants or small children, sit down when holding, feeding, or changing them to minimize risk of injury to the child in the event you have Zarek Relph seizure. Also, Maintain good sleep hygiene. Avoid alcohol.  You were seen by psychiatry who recommended follow up with an intensive outpatient program.    Return for new, recurrent, or worsening symptoms.   Please ask your PCP (if you don't have Akasia Ahmad PCP, you should establish with one after this hospitalization) to request records from this hospitalization so they know what was done and what the next steps will be.   Increase activity slowly   Complete by: As directed       Allergies as of 10/01/2023       Reactions   Onion Swelling   Throat swelling        Medication List    You have not been prescribed any medications.    Allergies  Allergen Reactions   Onion Swelling    Throat swelling    Follow-up Information     [facility] Follow up on 10/23/2023.   Why: Your appointment is at 10 am. Please arrive early and bring picture ID, insurance card and current medications Contact information: [address] [place],  [place]  [address]   [phone]                 The results of significant diagnostics from this hospitalization (including imaging, microbiology,  ancillary and laboratory) are listed below for reference.    Significant Diagnostic Studies: Overnight EEG with video  Result Date: 10/01/2023 Charlsie Quest, MD     10/01/2023 11:27 AM Patient Name: Stephen Underwood MRN: [id number] Epilepsy Attending: Charlsie Quest Referring Physician/Provider: Milon Dikes, MD Duration: 09/30/2023 0945 to 10/01/2023 1107 Patient history:  22 year old male with seizure-like activity in the setting of benzodiazepine use and THC use. EEG to evaluate for seizure Level of alertness: Awake, asleep AEDs during EEG study: None Technical aspects: This EEG study was done with scalp electrodes positioned according to the 10-20 International system of electrode placement. Electrical activity was reviewed with band pass filter of 1-70Hz , sensitivity of 7 uV/mm, display speed of 20mm/sec with Jillane Po 60Hz  notched filter applied as appropriate. EEG data were recorded continuously and digitally stored.  Video monitoring was available and reviewed as appropriate. Description: The posterior dominant rhythm consists of 9 Hz activity of moderate voltage (25-35 uV) seen predominantly in posterior head regions, symmetric and reactive to eye opening and eye closing. Sleep was characterized by vertex waves, sleep spindles (  12 to 14 Hz), maximal frontocentral region. Event button was pressed on 09/30/2023 at 1003. Patient reported feeling confused. Concomitant EEG before, during and after the event showed normal posterior dominant rhythm and did not show any EEG changes suggest seizure. No EEG change was seen during hyperventilation.  Physiologic photic driving was not seen during photic stimulation.   IMPRESSION: This study is within normal limits. No seizures or epileptiform discharges were seen throughout the recording. Event button was pressed on 09/30/2023 at 1003. Patient reported feeling confused without concomitant EEG change. This was most likely NOT an epileptic event. Kaelani Kendrick normal interictal  EEG does not exclude the diagnosis of epilepsy. Charlsie Quest   CT Head Wo Contrast  Result Date: 09/29/2023 CLINICAL DATA:  Syncopal episode. Altered mental status. Possible seizure activity. EXAM: CT HEAD WITHOUT CONTRAST TECHNIQUE: Contiguous axial images were obtained from the base of the skull through the vertex without intravenous contrast. RADIATION DOSE REDUCTION: This exam was performed according to the departmental dose-optimization program which includes automated exposure control, adjustment of the mA and/or kV according to patient size and/or use of iterative reconstruction technique. COMPARISON:  01/10/2023 FINDINGS: Brain: No evidence of intracranial hemorrhage, acute infarction, hydrocephalus, extra-axial collection, or mass lesion/mass effect. Vascular:  No hyperdense vessel or other acute findings. Skull: No evidence of fracture or other significant bone abnormality. Sinuses/Orbits:  No acute findings. Other: None. IMPRESSION: Negative noncontrast head CT. Electronically Signed   By: Danae Orleans M.D.   On: 09/29/2023 16:25   CT ABDOMEN PELVIS W CONTRAST  Result Date: 09/29/2023 CLINICAL DATA:  Acute abdominal pain.  Syncopal episode. EXAM: CT ABDOMEN AND PELVIS WITH CONTRAST TECHNIQUE: Multidetector CT imaging of the abdomen and pelvis was performed using the standard protocol following bolus administration of intravenous contrast. RADIATION DOSE REDUCTION: This exam was performed according to the departmental dose-optimization program which includes automated exposure control, adjustment of the mA and/or kV according to patient size and/or use of iterative reconstruction technique. CONTRAST:  75mL OMNIPAQUE IOHEXOL 350 MG/ML SOLN COMPARISON:  01/10/2023 FINDINGS: Lower Chest: No acute findings. Hepatobiliary: No suspicious hepatic masses or other lesions identified. Gallbladder is unremarkable. No evidence of biliary ductal dilatation. Pancreas:  No mass or inflammatory changes.  Spleen: Within normal limits in size and appearance. Adrenals/Urinary Tract: No suspicious masses identified. No evidence of ureteral calculi or hydronephrosis. Stomach/Bowel: No evidence of obstruction, inflammatory process or abnormal fluid collections. Vascular/Lymphatic: No pathologically enlarged lymph nodes. No acute vascular findings. Reproductive:  No mass or other significant abnormality. Other:  None. Musculoskeletal:  No suspicious bone lesions identified. IMPRESSION: No acute findings or other significant abnormality. Electronically Signed   By: Danae Orleans M.D.   On: 09/29/2023 16:24    Microbiology: No results found for this or any previous visit (from the past 240 hour(s)).   Labs: Basic Metabolic Panel: Recent Labs  Lab 09/24/23 1919 09/24/23 1921 09/29/23 1156 09/30/23 1107  NA 137 140 138 140  K 3.7 3.6 3.4* 3.9  CL 102 102 109 105  CO2 23  --  19* 24  GLUCOSE 99 101* 95 88  BUN 8 8 8 6   CREATININE 0.92 1.00 0.82 0.97  CALCIUM 9.3  --  8.8* 9.3  MG 2.0  --  1.9  --   PHOS 1.9*  --   --   --    Liver Function Tests: Recent Labs  Lab 09/24/23 1919 09/29/23 1156  AST 15 15  ALT 15 14  ALKPHOS 74 61  BILITOT 1.1 1.4*  PROT 7.0 6.6  ALBUMIN 4.0 3.8   Recent Labs  Lab 09/29/23 1156  LIPASE 24   No results for input(s): "AMMONIA" in the last 168 hours. CBC: Recent Labs  Lab 09/24/23 1919 09/24/23 1921 09/29/23 1156  WBC 5.5  --  9.3  NEUTROABS 3.6  --  7.6  HGB 14.8 14.6 14.2  HCT 43.2 43.0 41.3  MCV 83.9  --  83.8  PLT 191  --  199   Cardiac Enzymes: Recent Labs  Lab 09/24/23 1919 09/29/23 1156  CKTOTAL 68 89   BNP: BNP (last 3 results) No results for input(s): "BNP" in the last 8760 hours.  ProBNP (last 3 results) No results for input(s): "PROBNP" in the last 8760 hours.  CBG: No results for input(s): "GLUCAP" in the last 168 hours.     Signed:  Lacretia Nicks MD.  [facility] 10/01/2023, 12:42 PM

## 2023-10-01 NOTE — Telephone Encounter (Signed)
fixed

## 2023-10-01 NOTE — Progress Notes (Signed)
LTM EEG D/C'd. No skin break down noted. [facility] notified.

## 2023-10-01 NOTE — Telephone Encounter (Signed)
Copied from CRM [id number]. Topic: Appointments - Appointment Cancel/Reschedule >> Oct 01, 2023  1:28 PM Dennison Nancy wrote: Patient/patient representative is calling to cancel or reschedule an appointment. Refer to attachments for appointment information. Tresa Endo with case management call to cancel appointment dated 10/23/23

## 2023-10-01 NOTE — Procedures (Addendum)
Patient Name: Stephen Underwood  MRN: [id number]  Epilepsy Attending: Charlsie Quest  Referring Physician/Provider: Milon Dikes, MD  Duration: 09/30/2023 0945 to 10/01/2023 1107  Patient history:  22 year old male with seizure-like activity in the setting of benzodiazepine use and THC use. EEG to evaluate for seizure  Level of alertness: Awake, asleep  AEDs during EEG study: None  Technical aspects: This EEG study was done with scalp electrodes positioned according to the 10-20 International system of electrode placement. Electrical activity was reviewed with band pass filter of 1-70Hz , sensitivity of 7 uV/mm, display speed of 66mm/sec with a 60Hz  notched filter applied as appropriate. EEG data were recorded continuously and digitally stored.  Video monitoring was available and reviewed as appropriate.  Description: The posterior dominant rhythm consists of 9 Hz activity of moderate voltage (25-35 uV) seen predominantly in posterior head regions, symmetric and reactive to eye opening and eye closing. Sleep was characterized by vertex waves, sleep spindles (12 to 14 Hz), maximal frontocentral region.   Event button was pressed on 09/30/2023 at 1003. Patient reported feeling confused. Concomitant EEG before, during and after the event showed normal posterior dominant rhythm and did not show any EEG changes suggest seizure.  No EEG change was seen during hyperventilation.  Physiologic photic driving was not seen during photic stimulation.     IMPRESSION: This study is within normal limits. No seizures or epileptiform discharges were seen throughout the recording.  Event button was pressed on 09/30/2023 at 1003. Patient reported feeling confused without concomitant EEG change. This was most likely NOT an epileptic event.   A normal interictal EEG does not exclude the diagnosis of epilepsy.  [name] Annabelle Harman

## 2023-10-01 NOTE — TOC Transition Note (Signed)
Transition of Care [facility]) - CM/SW Discharge Note   Patient Details  Name: Stephen Underwood MRN: [id number] Date of Birth: Apr 03, 2001  [facility] of [facility] CM/SW Contact:  Kermit Balo, RN Phone Number: 10/01/2023, 12:48 PM   Clinical Narrative:     Pt is discharging home with self care. Pt has transportation home.  Final next level of care: Home/Self Care Barriers to Discharge: No Barriers Identified   Patient Goals and CMS Choice      Discharge Placement                         Discharge Plan and Services Additional resources added to the After Visit Summary for                                       Social Determinants of Health (SDOH) Interventions SDOH Screenings   Food Insecurity: No Food Insecurity (09/29/2023)  Housing: Medium Risk (09/29/2023)  Transportation Needs: Unmet Transportation Needs (09/29/2023)  Utilities: Not At Risk (09/29/2023)  Alcohol Screen: Low Risk  (11/08/2022)  Financial Resource Strain: Low Risk  (09/21/2019)  Physical Activity: Insufficiently Active (09/21/2019)  Social Connections: Socially Isolated (09/21/2019)  Stress: No Stress Concern Present (09/21/2019)  Tobacco Use: High Risk (09/29/2023)     Readmission Risk Interventions     No data to display

## 2023-10-01 NOTE — Plan of Care (Signed)
  Problem: Education: Goal: Knowledge of General Education information will improve Description: Including pain rating scale, medication(s)/side effects and non-pharmacologic comfort measures Outcome: Progressing   Problem: Clinical Measurements: Goal: Ability to maintain clinical measurements within normal limits will improve Outcome: Progressing Goal: Will remain free from infection Outcome: Progressing Goal: Diagnostic test results will improve Outcome: Progressing   Problem: Elimination: Goal: Will not experience complications related to bowel motility Outcome: Progressing Goal: Will not experience complications related to urinary retention Outcome: Progressing   Problem: Safety: Goal: Ability to remain free from injury will improve Outcome: Progressing   Problem: Skin Integrity: Goal: Risk for impaired skin integrity will decrease Outcome: Progressing

## 2023-10-04 DIAGNOSIS — R000 Tachycardia, unspecified: Secondary | ICD-10-CM | POA: Diagnosis not present

## 2023-10-04 DIAGNOSIS — K219 Gastro-esophageal reflux disease without esophagitis: Secondary | ICD-10-CM | POA: Diagnosis not present

## 2023-10-04 DIAGNOSIS — F1290 Cannabis use, unspecified, uncomplicated: Secondary | ICD-10-CM | POA: Diagnosis not present

## 2023-10-04 DIAGNOSIS — I4510 Unspecified right bundle-branch block: Secondary | ICD-10-CM | POA: Diagnosis not present

## 2023-10-04 DIAGNOSIS — R569 Unspecified convulsions: Secondary | ICD-10-CM | POA: Diagnosis not present

## 2023-10-04 DIAGNOSIS — J45909 Unspecified asthma, uncomplicated: Secondary | ICD-10-CM | POA: Diagnosis not present

## 2023-10-05 DIAGNOSIS — G4089 Other seizures: Secondary | ICD-10-CM | POA: Diagnosis not present

## 2023-10-05 DIAGNOSIS — F445 Conversion disorder with seizures or convulsions: Secondary | ICD-10-CM | POA: Diagnosis not present

## 2023-10-05 DIAGNOSIS — R569 Unspecified convulsions: Secondary | ICD-10-CM | POA: Diagnosis not present

## 2023-10-06 ENCOUNTER — Emergency Department (HOSPITAL_COMMUNITY)
Admission: EM | Admit: 2023-10-06 | Discharge: 2023-10-07 | Disposition: A | Discharge status: Home / Self Care | Payer: Federal, State, Local not specified - PPO | Attending: Emergency Medicine | Admitting: Emergency Medicine

## 2023-10-06 DIAGNOSIS — R9431 Abnormal electrocardiogram [ECG] [EKG]: Secondary | ICD-10-CM | POA: Diagnosis not present

## 2023-10-06 DIAGNOSIS — R000 Tachycardia, unspecified: Secondary | ICD-10-CM | POA: Diagnosis not present

## 2023-10-06 DIAGNOSIS — F445 Conversion disorder with seizures or convulsions: Secondary | ICD-10-CM | POA: Insufficient documentation

## 2023-10-06 DIAGNOSIS — I959 Hypotension, unspecified: Secondary | ICD-10-CM | POA: Diagnosis not present

## 2023-10-06 DIAGNOSIS — F419 Anxiety disorder, unspecified: Secondary | ICD-10-CM | POA: Insufficient documentation

## 2023-10-06 DIAGNOSIS — R569 Unspecified convulsions: Secondary | ICD-10-CM | POA: Diagnosis not present

## 2023-10-06 LAB — COMPREHENSIVE METABOLIC PANEL
ALT: 12 U/L (ref 0–44)
AST: 17 U/L (ref 15–41)
Albumin: 4.7 g/dL (ref 3.5–5.0)
Alkaline Phosphatase: 68 U/L (ref 38–126)
Anion gap: 11 (ref 5–15)
BUN: 14 mg/dL (ref 6–20)
CO2: 23 mmol/L (ref 22–32)
Calcium: 9.6 mg/dL (ref 8.9–10.3)
Chloride: 104 mmol/L (ref 98–111)
Creatinine, Ser: 1.11 mg/dL (ref 0.61–1.24)
GFR, Estimated: >60 mL/min (ref >60)
Glucose, Bld: 96 mg/dL (ref 70–99)
Potassium: 3.5 mmol/L (ref 3.5–5.1)
Sodium: 138 mmol/L (ref 135–145)
Total Bilirubin: 1.2 mg/dL — ABNORMAL HIGH (ref <1.2)
Total Protein: 8 g/dL (ref 6.5–8.1)

## 2023-10-06 LAB — RAPID URINE DRUG SCREEN, HOSP PERFORMED
Amphetamines: NOT DETECTED
Barbiturates: NOT DETECTED
Benzodiazepines: NOT DETECTED
Cocaine: NOT DETECTED
Opiates: NOT DETECTED
Tetrahydrocannabinol: POSITIVE — AB

## 2023-10-06 LAB — URINALYSIS, ROUTINE W REFLEX MICROSCOPIC
Bilirubin Urine: NEGATIVE
Glucose, UA: NEGATIVE mg/dL
Hgb urine dipstick: NEGATIVE
Ketones, ur: 5 mg/dL — AB
Leukocytes,Ua: NEGATIVE
Nitrite: NEGATIVE
Protein, ur: NEGATIVE mg/dL
Specific Gravity, Urine: 1.015 (ref 1.005–1.030)
pH: 6 (ref 5.0–8.0)

## 2023-10-06 LAB — CBC WITH DIFFERENTIAL/PLATELET
Abs Immature Granulocytes: 0.02 10*3/uL (ref 0.00–0.07)
Basophils Absolute: 0 10*3/uL (ref 0.0–0.1)
Basophils Relative: 0 %
Eosinophils Absolute: 0 10*3/uL (ref 0.0–0.5)
Eosinophils Relative: 0 %
HCT: 43.8 % (ref 39.0–52.0)
Hemoglobin: 15.4 g/dL (ref 13.0–17.0)
Immature Granulocytes: 0 %
Lymphocytes Relative: 27 %
Lymphs Abs: 2.4 10*3/uL (ref 0.7–4.0)
MCH: 29.9 pg (ref 26.0–34.0)
MCHC: 35.2 g/dL (ref 30.0–36.0)
MCV: 85 fL (ref 80.0–100.0)
Monocytes Absolute: 0.4 10*3/uL (ref 0.1–1.0)
Monocytes Relative: 5 %
Neutro Abs: 5.7 10*3/uL (ref 1.7–7.7)
Neutrophils Relative %: 68 %
Platelets: 252 10*3/uL (ref 150–400)
RBC: 5.15 MIL/uL (ref 4.22–5.81)
RDW: 12.7 % (ref 11.5–15.5)
WBC: 8.6 10*3/uL (ref 4.0–10.5)
nRBC: 0 % (ref 0.0–0.2)

## 2023-10-06 LAB — ETHANOL: Alcohol, Ethyl (B): <10 mg/dL (ref <10)

## 2023-10-06 LAB — CBG MONITORING, ED: Glucose-Capillary: 106 mg/dL — ABNORMAL HIGH (ref 70–99)

## 2023-10-06 NOTE — ED Triage Notes (Signed)
Pt BIB EMS , c/o anxiety and seizures, EMS reports pseudoseizures, pt not postictal and will stop if you ask him to. Pt denies being on medication for seizures but endorses stress. Seizure pads applied to bed, pt placed on cardiac monitor as well.

## 2023-10-06 NOTE — ED Provider Notes (Signed)
[place] EMERGENCY DEPARTMENT AT [facility] Provider Note   CSN: [id number] Arrival date & time: 10/06/23  2112     History  Chief Complaint  Patient presents with   Seizures    Stephen Underwood is a 22 y.o. male.  Pt is a 22 yo male with pmhx significant for pseudoseizures, polysubstance abuse, and anxiety.  Pt has a 41 week old newborn and has been under a lot of stress.  Pt has been to the hospital several times for possible seizures.  Pt was admitted overnight at [facility] from 11/11-11/13 and had an overnight EEG.  It was negative.  He was seen at [facility] in [place] on 11/16 and 11/17 for the same.  They also agreed it was due to psychosocial stressors.  He is willing to go on meds for anxiety, but no one has prescribed anything yet.  He is willing to talk to out mental health workers, but does not want to go to [facility].  He had another "seizure tonight."  No injuries.        Home Medications Prior to Admission medications   Not on File      Allergies    Onion    Review of Systems   Review of Systems  Neurological:  Positive for seizures.  Psychiatric/Behavioral:  The patient is nervous/anxious.   All other systems reviewed and are negative.   Physical Exam Updated Vital Signs BP (!) 127/91   Pulse 65   Temp 98.4 F (36.9 C) (Oral)   Resp 18   SpO2 96%  Physical Exam Vitals and nursing note reviewed.  Constitutional:      Appearance: Normal appearance.  HENT:     Head: Normocephalic and atraumatic.     Right Ear: External ear normal.     Left Ear: External ear normal.     Nose: Nose normal.     Mouth/Throat:     Mouth: Mucous membranes are moist.     Pharynx: Oropharynx is clear.  Eyes:     Extraocular Movements: Extraocular movements intact.     Conjunctiva/sclera: Conjunctivae normal.     Pupils: Pupils are equal, round, and reactive to light.  Cardiovascular:     Rate and Rhythm: Normal rate and regular rhythm.     Pulses: Normal pulses.      Heart sounds: Normal heart sounds.  Pulmonary:     Effort: Pulmonary effort is normal.     Breath sounds: Normal breath sounds.  Abdominal:     General: Abdomen is flat. Bowel sounds are normal.     Palpations: Abdomen is soft.  Musculoskeletal:        General: Normal range of motion.     Cervical back: Normal range of motion and neck supple.  Skin:    General: Skin is warm.     Capillary Refill: Capillary refill takes less than 2 seconds.  Neurological:     General: No focal deficit present.     Mental Status: He is alert and oriented to person, place, and time.  Psychiatric:        Mood and Affect: Mood normal.        Behavior: Behavior normal.     ED Results / Procedures / Treatments   Labs (all labs ordered are listed, but only abnormal results are displayed) Labs Reviewed  COMPREHENSIVE METABOLIC PANEL - Abnormal; Notable for the following components:      Result Value   Total Bilirubin 1.2 (*)  All other components within normal limits  URINALYSIS, ROUTINE W REFLEX MICROSCOPIC - Abnormal; Notable for the following components:   Ketones, ur 5 (*)    All other components within normal limits  RAPID URINE DRUG SCREEN, HOSP PERFORMED - Abnormal; Notable for the following components:   Tetrahydrocannabinol POSITIVE (*)    All other components within normal limits  CBG MONITORING, ED - Abnormal; Notable for the following components:   Glucose-Capillary 106 (*)    All other components within normal limits  CBC WITH DIFFERENTIAL/PLATELET  ETHANOL    EKG EKG Interpretation Date/Time:  Monday October 06 2023 21:52:31 EST Ventricular Rate:  83 PR Interval:  201 QRS Duration:  95 QT Interval:  357 QTC Calculation: 420 R Axis:   90  Text Interpretation: Sinus rhythm Borderline right axis deviation ST elev, probable normal early repol pattern Artifact in lead(s) I II III aVR aVL aVF No significant change since last tracing Confirmed by Jacalyn Lefevre [phone]) on  10/06/2023 10:04:31 PM  Radiology No results found.  Procedures Procedures    Medications Ordered in ED Medications - No data to display  ED Course/ Medical Decision Making/ A&P                                 Medical Decision Making Amount and/or Complexity of Data Reviewed Labs: ordered.   This patient presents to the ED for concern of seizure, this involves an extensive number of treatment options, and is a complaint that carries with it a high risk of complications and morbidity.  The differential diagnosis includes seizure, pseudoseizure, infection, electrolyte abn, drugs   Co morbidities that complicate the patient evaluation  pseudoseizures, polysubstance abuse, and anxiety   Additional history obtained:  Additional history obtained from epic chart review External records from outside source obtained and reviewed including EMS report   Lab Tests:  I Ordered, and personally interpreted labs.  The pertinent results include:  cbc nl, cmp nl, ua neg, etoh neg, uds + mj   Cardiac Monitoring:  The patient was maintained on a cardiac monitor.  I personally viewed and interpreted the cardiac monitored which showed an underlying rhythm of: nsr   Medicines ordered and prescription drug management:   I have reviewed the patients home medicines and have made adjustments as needed   Consultations Obtained:  I requested consultation with TTS,  and discussed lab and imaging findings as well as pertinent plan - consult pending   Problem List / ED Course:  Pseudoseizure:  pt is medically clear.  He's been evaluated by neurology and has had overnight EEG.  He is willing to see psych.  He is voluntary and does not meet involuntary criteria. Anxiety:  pt will likely need ssri.  Await psych suggestions.   Reevaluation:  After the interventions noted above, I reevaluated the patient and found that they have :improved   Social Determinants of Health:  Lives at  home   Dispostion:  After consideration of the diagnostic results and the patients response to treatment, I feel that the patent would benefit from pending per TTS.          Final Clinical Impression(s) / ED Diagnoses Final diagnoses:  Psychogenic nonepileptic seizure  Anxiety    Rx / DC Orders ED Discharge Orders     None         Jacalyn Lefevre, MD 10/06/23 2254

## 2023-10-06 NOTE — ED Notes (Signed)
Tech in room, getting pt vitals

## 2023-10-07 ENCOUNTER — Telehealth (HOSPITAL_COMMUNITY): Payer: Self-pay

## 2023-10-07 MED ORDER — ESCITALOPRAM OXALATE 10 MG PO TABS: 10.0000 mg | ORAL_TABLET | Freq: Every day | ORAL | 3 refills | Status: DC
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
## 2023-10-07 NOTE — ED Notes (Signed)
Pt being TTS'd 

## 2023-10-08 ENCOUNTER — Encounter (HOSPITAL_COMMUNITY): Payer: Self-pay

## 2023-10-08 ENCOUNTER — Ambulatory Visit (HOSPITAL_COMMUNITY)
Admission: EM | Admit: 2023-10-08 | Discharge: 2023-10-08 | Disposition: A | Discharge status: Home / Self Care | Payer: Federal, State, Local not specified - PPO | Attending: Emergency Medicine | Admitting: Emergency Medicine

## 2023-10-08 DIAGNOSIS — R1084 Generalized abdominal pain: Secondary | ICD-10-CM

## 2023-10-08 DIAGNOSIS — R197 Diarrhea, unspecified: Secondary | ICD-10-CM | POA: Diagnosis not present

## 2023-10-08 MED ORDER — LIDOCAINE VISCOUS HCL 2 % MT SOLN
OROMUCOSAL | Status: AC
  Filled 2023-10-08: qty 15

## 2023-10-08 MED ORDER — DICYCLOMINE HCL 20 MG PO TABS: 20.0000 mg | ORAL_TABLET | Freq: Two times a day (BID) | ORAL | 0 refills | Status: AC

## 2023-10-08 MED ORDER — LIDOCAINE VISCOUS HCL 2 % MT SOLN
15.0000 mL | Freq: Once | OROMUCOSAL | Status: AC
  Administered 2023-10-08: 15 mL via OROMUCOSAL

## 2023-10-08 MED ORDER — ALUM & MAG HYDROXIDE-SIMETH 200-200-20 MG/5ML PO SUSP
30.0000 mL | Freq: Once | ORAL | Status: AC
  Administered 2023-10-08: 30 mL via ORAL

## 2023-10-08 MED ORDER — ALUM & MAG HYDROXIDE-SIMETH 200-200-20 MG/5ML PO SUSP
ORAL | Status: AC
  Filled 2023-10-08: qty 30

## 2023-10-08 NOTE — Telephone Encounter (Signed)
Pt called and having bad stomach pains and has questions about med's

## 2023-10-08 NOTE — Discharge Instructions (Signed)
You can take the Bentyl up to 2 times daily to help with abdominal pain and spasms.  Ensure you are staying well-hydrated with at least 64 ounces of water daily.  We also suggest the brat diet to help minimize irritation to the GI system.  This is bananas, rice, toast and applesauce as well as other bland foods.  Avoid heavily processed, fried or spicy foods.  Seek immediate care at the nearest emergency department if you develop fever, are unable to hold down food or fluids, or have any new acute changes.

## 2023-10-08 NOTE — Telephone Encounter (Signed)
Pt was advised [facility] 

## 2023-10-08 NOTE — ED Provider Notes (Signed)
[facility]    CSN: [id number] Arrival date & time: 10/08/23  1250      History   Chief Complaint Chief Complaint  Patient presents with   Abdominal Pain    HPI Stephen Underwood is a 22 y.o. male.   Patient presents to clinic for left lower abdominal pain that has been present for the past 4 days.  Describes as a tightness.  Has increased pain and discomfort with eating.  He has had nausea, no emesis.  Endorses frequent diarrhea for the past 2 weeks.  No fevers.  He has tried Tylenol for symptoms without much relief.  He has a history of pseudoseizures, polysubstance abuse and anxiety.  Just started on Lexapro.  He has been seen at local emergency departments on 11/16, 11/17 and 11/18.  Recently was complaining of abdominal pain and pseudoseizure, and had a CT scan via local emergency department that was unremarkable on 11/11.   The history is provided by the patient and medical records.  Abdominal Pain   Past Medical History:  Diagnosis Date   History of tympanostomy tube placement 2003   Mild to moderate hearing loss    Seizures (HCC)     Patient Active Problem List   Diagnosis Date Noted   Polysubstance abuse (HCC) 09/29/2023   Seizure-like activity (HCC) 09/29/2023   Metabolic acidosis 09/29/2023   Hip dislocation, left, initial encounter (HCC) 01/11/2023   Liver laceration, grade III, with open wound into cavity, initial encounter 01/10/2023   MDD (major depressive disorder), recurrent episode, severe (HCC) 11/08/2022   Status epilepticus (HCC) 11/05/2022   Chronic respiratory failure with hypoxia (HCC) 11/05/2022   Moderate malnutrition (HCC) 09/21/2019   Poor appetite 09/21/2019   Weight loss 09/20/2019   Vomiting 09/20/2019   Vision changes 09/20/2019    History reviewed. No pertinent surgical history.     Home Medications    Prior to Admission medications   Medication Sig Start Date End Date Taking? Authorizing Provider  dicyclomine  (BENTYL) 20 MG tablet Take 1 tablet (20 mg total) by mouth 2 (two) times daily. 10/08/23  Yes Rinaldo Ratel, [place] N, FNP  escitalopram (LEXAPRO) 10 MG tablet Take 1 tablet (10 mg total) by mouth daily. Take 5 mg for 4 days then increase to 10 mg daily 10/07/23 10/06/24 Yes Dian Situ, MD    Family History Family History  Problem Relation Age of Onset   Anemia Mother    Depression Mother    Heart murmur Father    ADD / ADHD Father    Epilepsy Father    Peptic Ulcer Father    Bipolar disorder Father    Ovarian cancer Maternal Grandmother    Hypertension Paternal Grandfather    Liver disease Paternal Grandfather    Heart disease Paternal Grandfather     Social History Social History   Tobacco Use   Smoking status: Every Day    Types: E-cigarettes   Smokeless tobacco: Former  Advertising account planner   Vaping status: Every Day   Start date: 11/18/2018   Substances: Nicotine, CBD   Devices: Vape, e-juice  Substance Use Topics   Alcohol use: Not Currently    Comment: 1-2 times per month   Drug use: Yes    Frequency: 3.0 times per week    Types: Marijuana    Comment: none this am     Allergies   Onion   Review of Systems Review of Systems  Per HPI   Physical Exam Triage  Vital Signs ED Triage Vitals  Encounter Vitals Group     BP 10/08/23 1318 130/82     Systolic BP Percentile --      Diastolic BP Percentile --      Pulse Rate 10/08/23 1318 82     Resp 10/08/23 1318 16     Temp 10/08/23 1318 98.1 F (36.7 C)     Temp Source 10/08/23 1318 Oral     SpO2 10/08/23 1318 96 %     Weight 10/08/23 1318 130 lb (59 kg)     Height 10/08/23 1318 5\' 9"  (1.753 m)     Head Circumference --      Peak Flow --      Pain Score 10/08/23 1317 7     Pain Loc --      Pain Education --      Exclude from Growth Chart --    No data found.  Updated Vital Signs BP 130/82 (BP Location: Left Arm)   Pulse 82   Temp 98.1 F (36.7 C) (Oral)   Resp 16   Ht 5\' 9"  (1.753 m)   Wt 130 lb (59  kg)   SpO2 96%   BMI 19.20 kg/m   Visual Acuity Right Eye Distance:   Left Eye Distance:   Bilateral Distance:    Right Eye Near:   Left Eye Near:    Bilateral Near:     Physical Exam Vitals and nursing note reviewed.  Constitutional:      Appearance: He is well-developed.  HENT:     Head: Normocephalic and atraumatic.  Cardiovascular:     Rate and Rhythm: Normal rate.  Pulmonary:     Effort: Pulmonary effort is normal. No respiratory distress.  Abdominal:     General: Abdomen is flat. Bowel sounds are normal.     Palpations: Abdomen is soft.     Tenderness: There is abdominal tenderness in the left lower quadrant. There is no guarding or rebound.  Skin:    General: Skin is warm.  Neurological:     General: No focal deficit present.     Mental Status: He is alert.  Psychiatric:        Mood and Affect: Mood is anxious.      UC Treatments / Results  Labs (all labs ordered are listed, but only abnormal results are displayed) Labs Reviewed - No data to display  EKG   Radiology No results found.  Procedures Procedures (including critical care time)  Medications Ordered in UC Medications  alum & mag hydroxide-simeth (MAALOX/MYLANTA) 200-200-20 MG/5ML suspension 30 mL (30 mLs Oral Given 10/08/23 1352)  lidocaine (XYLOCAINE) 2 % viscous mouth solution 15 mL (15 mLs Mouth/Throat Given 10/08/23 1352)    Initial Impression / Assessment and Plan / UC Course  I have reviewed the triage vital signs and the nursing notes.  Pertinent labs & imaging results that were available during my care of the patient were reviewed by me and considered in my medical decision making (see chart for details).  Vitals and triage reviewed, patient is hemodynamically stable.  Abdomen is soft with active bowel sounds, left lower quadrant tenderness to palpation without rebound or guarding.  Has had unremarkable advanced imaging recently.  Diarrhea could be related to anxiety.  Will trial  GI cocktail and Bentyl.  No red flag symptoms on exam, but strict ER precautions given if symptoms worsen.  Plan of care, follow-up care and return precautions given, no questions at this time.  Final Clinical Impressions(s) / UC Diagnoses   Final diagnoses:  Generalized abdominal pain  Diarrhea, unspecified type     Discharge Instructions      You can take the Bentyl up to 2 times daily to help with abdominal pain and spasms.  Ensure you are staying well-hydrated with at least 64 ounces of water daily.  We also suggest the brat diet to help minimize irritation to the GI system.  This is bananas, rice, toast and applesauce as well as other bland foods.  Avoid heavily processed, fried or spicy foods.  Seek immediate care at the nearest emergency department if you develop fever, are unable to hold down food or fluids, or have any new acute changes.      ED Prescriptions     Medication Sig Dispense Auth. Provider   dicyclomine (BENTYL) 20 MG tablet Take 1 tablet (20 mg total) by mouth 2 (two) times daily. 20 tablet Dellene Mcgroarty, [place], [place]      PDMP not reviewed this encounter.   Cherylynn Liszewski, [place] N, [place] 10/08/23 [phone]

## 2023-10-08 NOTE — ED Triage Notes (Signed)
Patient here today with c/o left side abd pain X 4 days. He states that he feels tightness. Difficulty with BM X 2 days. Increased pain when eating. He has been taking Tylenol with some relief.

## 2023-10-08 NOTE — Telephone Encounter (Signed)
Pt called about abd pain. Pt stated he is losing weight and can not sleep. He has an appt 10/13/23. Should he be seen sooner or wait until his appt . If he needs to wait please advise what he can do while he wait for his appt.   KH

## 2023-10-10 ENCOUNTER — Ambulatory Visit (HOSPITAL_COMMUNITY)
Admission: EM | Admit: 2023-10-10 | Discharge: 2023-10-10 | Disposition: A | Discharge status: Home / Self Care | Payer: Federal, State, Local not specified - PPO

## 2023-10-10 DIAGNOSIS — F331 Major depressive disorder, recurrent, moderate: Secondary | ICD-10-CM | POA: Diagnosis not present

## 2023-10-10 NOTE — Discharge Instructions (Signed)

## 2023-10-10 NOTE — ED Notes (Signed)
Patient discharged home. AVS reviewed and given. Pt voiced understanding. Patient walked to the lobby by staff after receiving his belongings. Safety maintained.

## 2023-10-10 NOTE — ED Provider Notes (Signed)
Behavioral Health Urgent Care Medical Screening Exam  Patient Name: Stephen Underwood MRN: [id number] Date of Evaluation: 10/10/23 Chief Complaint:   Diagnosis:  Final diagnoses:  Moderate episode of recurrent major depressive disorder (HCC)    History of Present illness: Stephen Underwood is a 22 y.o. male.   Patient presents to [facility] voluntarily complaining of increasing stress related to his psychosocial situation. He  states he had a rough night, weird dreams and thoughts. Pt states the he had a weird night with lots of thoughts in which he felt like "I am not gonna be here too much longer". Patient has a 63 month old child whom he has been thinking about and has been thinking about how to be a good dad to his newborn.  On 09/29/2023, patient went to [facility] in [place] due to the same issues. Four days ago, patient was put on Lexapro, which he is taking. He also takes Amoxicillin for abdominal pain. 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 He reports a hx of polysubstance abuse which he stopped last December upon discharge from inpatient.  Patient reports feeling worried about his health and life in general. He reports that he was terminated from his job "because they think I am a liability..., due to my seizures".  His anxiety has been increasing as he is thinking too much about his condition, his child and his girlfriend. He reports that he does not have any outpatient psychiatric/mental health outpatient services. Patient denies suicidal thoughts but reports that he feels hopeless and helpless. He reports that his family is supportive. His girlfriend is also supportive and she is the one who encouraged him to seek help.  Patient reports that he started abusing drugs when he moved out of his parents' house and chose to be homeless to do drugs "I chose the wrong crew".    Assessment: Patient is a 22 year-old male who appears  anxious and disheveled. He is cooperative throughout this encounter. He is alert and oriented x 4. His eye contact is fair. His thought process is coherent and goal-directed. Patient does not appear to be preoccupied or responding to internal stimuli. Patient appears to be undernourished, tired and depressed. He reports that his appetite is "bad" and he has been losing weight. He reports that he has not been ale to sleep due to "too much thinking". He appears disheveled, helpless and worried. He denies SI/HI/AVH and reports that "I probably need to get a therapist".  Patient reports that his family and girlfriend are supportive.  He reports that he was worried about the seizure problem but was told that its not dangerous. He is now focused on his mental health, reporting that he needs to start  in outpatient services.  Patient admits to hx of polysubstance abuse but has stayed away from drugs since last December "I only vape...". He reports that he has been experiencing abdominal pain for which he has been examined and prescribed Amoxicillin. Reports that he has been having migraines which he relates to "thinking too much".  Patient denies respiratory distress. He denies chest pain. He  admits to not being able to eat due to decreased appetite. He has been experiencing insomnia with max sleep of 2 hrs a night.  Patient currently lives with his parents, unemployed. His girlfriend is supportive and she is the one who encouraged him to seek mental health help.   Patient does  not appear/sound to be dangerous to himself or others and can program in outpatient setting. I discussed this case with DR Lucianne Muss and she agreed  with the plan.  Patient is planning to return to [facility]  for outpatient services for therapy and medication management.  Safety plan discussed with patient and his  girlfriend  Lucien Mons [phone]. Patient will be discharged to  his girlfriend's house because his family is not at home.  Additional resources provided for community support. Brief counseling and encouragements provided and patient stated that "I feel much better, I can't wait to start therapy, I think that's what I need".      Flowsheet Row ED from 10/10/2023 in [facility] ED from 10/08/2023 in [facility] ED from 10/06/2023 in [facility] Emergency Department at [facility]  C-SSRS RISK CATEGORY Moderate Risk No Risk No Risk       Psychiatric Specialty Exam  Presentation  General Appearance:Appropriate for Environment  Eye Contact:Fair  Speech:Clear and Coherent  Speech Volume:Normal  Handedness:Right   Mood and Affect  Mood: Depressed; Anxious  Affect: Appropriate   Thought Process  Thought Processes: Coherent  Descriptions of Associations:Intact  Orientation:Full (Time, Place and Person)  Thought Content:Logical    Hallucinations:None  Ideas of Reference:None  Suicidal Thoughts:No -- (Denies)  Homicidal Thoughts:No   Sensorium  Memory: Immediate Good; Recent Good; Remote Good  Judgment: Fair  Insight: Fair   Chartered certified accountant: Fair  Attention Span: Fair  Recall: [employer] of Knowledge: Fair  Language: Fair   Psychomotor Activity  Psychomotor Activity: Restlessness   Assets  Assets: Manufacturing systems engineer; Desire for Improvement; Physical Health; Intimacy   Sleep  Sleep: Poor  Number of hours:  2   Physical Exam: Physical Exam Vitals and nursing note reviewed.  Constitutional:      Appearance: Normal appearance.  HENT:     Head: Normocephalic and atraumatic.     Right Ear: Tympanic membrane normal.     Left Ear: Tympanic membrane normal.     Nose: Nose normal.  Eyes:     Extraocular Movements: Extraocular movements intact.     Pupils: Pupils are equal, round, and reactive to light.  Cardiovascular:     Rate and Rhythm: Normal rate.      Pulses: Normal pulses.  Pulmonary:     Effort: Pulmonary effort is normal.  Musculoskeletal:        General: Normal range of motion.     Cervical back: Normal range of motion and neck supple.  Neurological:     General: No focal deficit present.     Mental Status: He is alert and oriented to person, place, and time.  Psychiatric:        Thought Content: Thought content normal.    Review of Systems  Constitutional: Negative.   HENT: Negative.    Eyes: Negative.   Respiratory: Negative.    Cardiovascular: Negative.   Gastrointestinal: Negative.   Genitourinary: Negative.   Musculoskeletal: Negative.   Skin: Negative.   Neurological: Negative.   Endo/Heme/Allergies: Negative.   Psychiatric/Behavioral:  Positive for depression. The patient is nervous/anxious and has insomnia.    Blood pressure (!) 145/100, pulse 85, temperature (!) 97.4 F (36.3 C), temperature source Oral, resp. rate 17, SpO2 99%. There is no height or weight on file to calculate BMI.  Musculoskeletal: Strength & Muscle Tone: within normal limits Gait & Station: normal Patient leans: N/A  [facility] MSE Discharge Disposition for Follow up and Recommendations: Based on my evaluation the patient does not appear to have an emergency medical condition and can be discharged with resources and follow up care in outpatient services for Medication Management, Substance Abuse Intensive Outpatient Program, Individual Therapy, and Group Therapy   Olin Pia, NP 10/10/2023, 12:17 PM

## 2023-10-10 NOTE — Telephone Encounter (Signed)
Pt has some questions about med's and suicide thoughts and needs some answers.  He just left behavior center and they told him to call us.

## 2023-10-10 NOTE — Progress Notes (Signed)
   10/10/23 1115  [facility] Triage Screening (Walk-ins at [facility] only)  How Did You Hear About [place]? Family/Friend  What Is the Reason for Your Visit/Call Today? Pt presents to [facility] voluntarily accompanied by a friend. Pt states he had a rough night, weird dreams and thoughts. Pt states the he had thoughts of hurting himself this morning. Pt states he was in another facility in December because of overdose, due to his thoughts. Pt states that he was just put on Lexapro 4 days ago. Pt states that he is unsure if he will hurt himself at this time but the thoughts are in his head. Pt denies HI, AH, and alcohol/drug use at this present time. Pt states he has VH where he can see dreams when he is awake and used a tobacco vape this morning.  How Long Has This Been Causing You Problems? 1 wk - 1 month  Have You Recently Had Any Thoughts About Hurting Yourself? Yes  How long ago did you have thoughts about hurting yourself? this morning - no plan  Are You Planning to Commit Suicide/Harm Yourself At This time?  (unsure)  Have you Recently Had Thoughts About Hurting Someone Karolee Ohs? No  Are You Planning To Harm Someone At This Time? No  Are you currently experiencing any auditory, visual or other hallucinations? Yes  Please explain the hallucinations you are currently experiencing: visual - sees dreams when he is awake  Have You Used Any Alcohol or Drugs in the Past 24 Hours? Yes  How long ago did you use Drugs or Alcohol? this morning  What Did You Use and How Much? tobacco vape  Do you have any current medical co-morbidities that require immediate attention? Yes  Please describe current medical co-morbidities that require immediate attention: has a hx of seizures  Clinician description of patient physical appearance/behavior: casually dressed, calm, cooperative  What Do You Feel Would Help You the Most Today? Social Support;Treatment for Depression or other mood problem;Medication(s)  If access to [facility] was  not available, would you have sought care in the Emergency Department? No  Determination of Need Urgent (48 hours)  Options For Referral Inpatient Hospitalization;Medication Management;Outpatient Therapy

## 2023-10-13 ENCOUNTER — Encounter: Payer: Self-pay | Admitting: Nurse Practitioner

## 2023-10-13 ENCOUNTER — Ambulatory Visit (INDEPENDENT_AMBULATORY_CARE_PROVIDER_SITE_OTHER): Payer: Federal, State, Local not specified - PPO | Admitting: Nurse Practitioner

## 2023-10-13 VITALS — BP 121/84 | HR 90 | Temp 98.0°F | Ht 69.0 in | Wt 130.2 lb

## 2023-10-13 DIAGNOSIS — R569 Unspecified convulsions: Secondary | ICD-10-CM

## 2023-10-13 DIAGNOSIS — F1910 Other psychoactive substance abuse, uncomplicated: Secondary | ICD-10-CM

## 2023-10-13 DIAGNOSIS — Z131 Encounter for screening for diabetes mellitus: Secondary | ICD-10-CM

## 2023-10-13 DIAGNOSIS — R110 Nausea: Secondary | ICD-10-CM | POA: Insufficient documentation

## 2023-10-13 DIAGNOSIS — Z8711 Personal history of peptic ulcer disease: Secondary | ICD-10-CM

## 2023-10-13 DIAGNOSIS — R109 Unspecified abdominal pain: Secondary | ICD-10-CM | POA: Insufficient documentation

## 2023-10-13 DIAGNOSIS — F419 Anxiety disorder, unspecified: Secondary | ICD-10-CM | POA: Insufficient documentation

## 2023-10-13 DIAGNOSIS — R350 Frequency of micturition: Secondary | ICD-10-CM

## 2023-10-13 DIAGNOSIS — Z720 Tobacco use: Secondary | ICD-10-CM | POA: Insufficient documentation

## 2023-10-13 DIAGNOSIS — F332 Major depressive disorder, recurrent severe without psychotic features: Secondary | ICD-10-CM

## 2023-10-13 DIAGNOSIS — E440 Moderate protein-calorie malnutrition: Secondary | ICD-10-CM

## 2023-10-13 HISTORY — DX: Personal history of peptic ulcer disease: Z87.11

## 2023-10-13 LAB — POCT URINALYSIS DIP (CLINITEK)
Blood, UA: NEGATIVE
Glucose, UA: NEGATIVE mg/dL
Ketones, POC UA: NEGATIVE mg/dL
Leukocytes, UA: NEGATIVE
Nitrite, UA: NEGATIVE
POC PROTEIN,UA: NEGATIVE
Spec Grav, UA: >=1.03 — AB (ref 1.010–1.025)
Urobilinogen, UA: 0.2 U/dL
pH, UA: 5.5 (ref 5.0–8.0)

## 2023-10-13 LAB — POCT GLYCOSYLATED HEMOGLOBIN (HGB A1C): Hemoglobin A1C: 5.2 % (ref 4.0–5.6)

## 2023-10-13 MED ORDER — OMEPRAZOLE 40 MG PO CPDR: 40.0000 mg | DELAYED_RELEASE_CAPSULE | Freq: Every day | ORAL | 3 refills | Status: DC

## 2023-10-13 MED ORDER — ONDANSETRON HCL 4 MG PO TABS: 4.0000 mg | ORAL_TABLET | Freq: Three times a day (TID) | ORAL | 0 refills | Status: DC | PRN

## 2023-10-13 NOTE — Assessment & Plan Note (Signed)
Zofran 4 mg every 8 hours as needed ordered Patient referred to GI

## 2023-10-13 NOTE — Assessment & Plan Note (Signed)
Reports poor appetite unintentional weight loss Samples of Ensure nutritional supplement provided with coupons Patient referred to the nutritionist

## 2023-10-13 NOTE — Assessment & Plan Note (Signed)
Stated that he has not used Percocet, Xanax, ecstasy in almost a year Need to continue to avoid use of illicit drugs discussed

## 2023-10-13 NOTE — Assessment & Plan Note (Signed)
Vapes nicotine containing substances daily Cessation encouraged

## 2023-10-13 NOTE — Assessment & Plan Note (Signed)
Will restart omeprazole 40 mg daily Avoid NSAIDs, spicy food, fried food, caffeinated drinks, alcohol, tobacco and other things that trigger symptoms Patient referred to GI
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
## 2023-10-13 NOTE — Assessment & Plan Note (Signed)
Most likely musculoskeletal pain Encouraged to take OTC Tylenol as needed, use of heating pad encouraged

## 2023-10-13 NOTE — Assessment & Plan Note (Addendum)
Recent abdominal CT was unremarkable Continue dicyclomine 20 mg take only as needed Patient referred to GI for further evaluation Starting omeprazole 40 mg daily due to history of gastric ulcer CLINICAL DATA:  Acute abdominal pain.  Syncopal episode.   EXAM: CT ABDOMEN AND PELVIS WITH CONTRAST   TECHNIQUE: Multidetector CT imaging of the abdomen and pelvis was performed using the standard protocol following bolus administration of intravenous contrast.   RADIATION DOSE REDUCTION: This exam was performed according to the departmental dose-optimization program which includes automated exposure control, adjustment of the mA and/or kV according to patient size and/or use of iterative reconstruction technique.   CONTRAST:  75mL OMNIPAQUE IOHEXOL 350 MG/ML SOLN   COMPARISON:  01/10/2023   FINDINGS: Lower Chest: No acute findings.   Hepatobiliary: No suspicious hepatic masses or other lesions identified. Gallbladder is unremarkable. No evidence of biliary ductal dilatation.   Pancreas:  No mass or inflammatory changes.   Spleen: Within normal limits in size and appearance.   Adrenals/Urinary Tract: No suspicious masses identified. No evidence of ureteral calculi or hydronephrosis.   Stomach/Bowel: No evidence of obstruction, inflammatory process or abnormal fluid collections.   Vascular/Lymphatic: No pathologically enlarged lymph nodes. No acute vascular findings.   Reproductive:  No mass or other significant abnormality.   Other:  None.   Musculoskeletal:  No suspicious bone lesions identified.   IMPRESSION: No acute findings or other significant abnormality.

## 2023-10-13 NOTE — Assessment & Plan Note (Signed)
    10/13/2023    9:11 AM  GAD 7 : Generalized Anxiety Score  Nervous, Anxious, on Edge 3  Control/stop worrying 2  Worry too much - different things 1  Trouble relaxing 3  Restless 3  Easily annoyed or irritable 3  Afraid - awful might happen 3  Total GAD 7 Score 18  Anxiety Difficulty Extremely difficult  Continue Lexapro 10 mg daily Patient currently denies SI, HI Patient referred to psychiatrist Encouraged to seek care at the [facility] behavioral health care center as needed for crisis

## 2023-10-13 NOTE — Assessment & Plan Note (Signed)
UA negative for UTI, patient is not diabetic

## 2023-10-13 NOTE — Assessment & Plan Note (Signed)
     10/10/2023   12:16 PM  PHQ9 SCORE ONLY  PHQ-9 Total Score 8  Continue Lexapro 10 mg daily Patient currently denies SI, HI Patient referred to psychiatrist Encouraged to seek care at the [facility] behavioral health care center as needed for crisis

## 2023-10-13 NOTE — Patient Instructions (Signed)
1. Abdominal pain, unspecified abdominal location  - Ambulatory referral to Gastroenterology - omeprazole (PRILOSEC) 40 MG capsule; Take 1 capsule (40 mg total) by mouth daily.  Dispense: 30 capsule; Refill: 3  2. Anxiety and depression  - Ambulatory referral to Psychiatry  3. History of gastric ulcer  - omeprazole (PRILOSEC) 40 MG capsule; Take 1 capsule (40 mg total) by mouth daily.  Dispense: 30 capsule; Refill: 3  4. Seizure-like activity (HCC)  - Ambulatory referral to Neurology  5. Nausea  - ondansetron (ZOFRAN) 4 MG tablet; Take 1 tablet (4 mg total) by mouth every 8 (eight) hours as needed for nausea or vomiting.  Dispense: 20 tablet; Refill: 0  6. Polysubstance abuse (HCC)   7. Moderate malnutrition (HCC)  - Amb Referral to Nutrition and Diabetic Education    It is important that you exercise regularly at least 30 minutes 5 times a week as tolerated  Think about what you will eat, plan ahead. Choose " clean, green, fresh or frozen" over canned, processed or packaged foods which are more sugary, salty and fatty. 70 to 75% of food eaten should be vegetables and fruit. Three meals at set times with snacks allowed between meals, but they must be fruit or vegetables. Aim to eat over a 12 hour period , example 7 am to 7 pm, and STOP after  your last meal of the day. Drink water,generally about 64 ounces per day, no other drink is as healthy. Fruit juice is best enjoyed in a healthy way, by EATING the fruit.  Thanks for choosing [facility] we consider it a privelige to serve you.

## 2023-10-13 NOTE — Assessment & Plan Note (Signed)
Had another seizure-like episode at work after his last hospitalization Patient referred to neurology

## 2023-10-22 ENCOUNTER — Ambulatory Visit: Payer: Federal, State, Local not specified - PPO | Admitting: Registered"

## 2023-10-23 ENCOUNTER — Encounter: Payer: Self-pay | Admitting: Family Medicine

## 2023-10-28 ENCOUNTER — Ambulatory Visit: Payer: Federal, State, Local not specified - PPO | Admitting: Nurse Practitioner

## 2023-11-03 ENCOUNTER — Ambulatory Visit: Payer: Federal, State, Local not specified - PPO | Admitting: Nurse Practitioner

## 2023-11-06 ENCOUNTER — Ambulatory Visit: Payer: Federal, State, Local not specified - PPO | Admitting: Gastroenterology

## 2023-11-06 ENCOUNTER — Telehealth: Payer: Self-pay | Admitting: *Deleted

## 2023-11-06 ENCOUNTER — Encounter: Payer: Self-pay | Admitting: Gastroenterology

## 2023-11-06 VITALS — BP 118/72 | HR 79 | Ht 69.0 in | Wt 140.2 lb

## 2023-11-06 DIAGNOSIS — R194 Change in bowel habit: Secondary | ICD-10-CM

## 2023-11-06 DIAGNOSIS — R1013 Epigastric pain: Secondary | ICD-10-CM

## 2023-11-06 DIAGNOSIS — R634 Abnormal weight loss: Secondary | ICD-10-CM

## 2023-11-06 DIAGNOSIS — R110 Nausea: Secondary | ICD-10-CM

## 2023-11-06 DIAGNOSIS — R109 Unspecified abdominal pain: Secondary | ICD-10-CM

## 2023-11-06 DIAGNOSIS — F1290 Cannabis use, unspecified, uncomplicated: Secondary | ICD-10-CM

## 2023-11-06 DIAGNOSIS — Z8711 Personal history of peptic ulcer disease: Secondary | ICD-10-CM

## 2023-11-06 MED ORDER — OMEPRAZOLE 40 MG PO CPDR: 40.0000 mg | DELAYED_RELEASE_CAPSULE | Freq: Two times a day (BID) | ORAL | Status: DC

## 2023-11-06 MED ORDER — CITRUCEL PO POWD: 1.0000 | Freq: Every day | ORAL | Status: AC

## 2023-11-06 MED ORDER — PROMETHAZINE HCL 12.5 MG PO TABS
12.5000 mg | ORAL_TABLET | Freq: Three times a day (TID) | ORAL | 0 refills | Status: AC | PRN
Start: 2023-11-06 — End: ?

## 2023-11-06 NOTE — Telephone Encounter (Signed)
Thanks Stephen Underwood. 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 Recent normal EEG and had a prior EEG that was normal in the setting of seizure like activity. The neurologist did not recommend any seizure medication and they are treating for anxiety. He is feeling better on Lexapro. Thanks

## 2023-11-06 NOTE — Telephone Encounter (Signed)
Dr. Adela Lank,  Uncontrolled seizures has been agreed upon by the members of the practice as a condition which excludes a pt from care at [facility].  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  Please be advised he is at risk for one of these events both pre- and post-procedure.  Regards,  Cathlyn Parsons

## 2023-11-06 NOTE — Patient Instructions (Addendum)
If your blood pressure at your visit was 140/90 or greater, please contact your primary care physician to follow up on this. ______________________________________________________  If you are age 23 or older, your body mass index should be between 23-30. Your Body mass index is 20.71 kg/m. If this is out of the aforementioned range listed, please consider follow up with your Primary Care Provider.  If you are age 32 or younger, your body mass index should be between 19-25. Your Body mass index is 20.71 kg/m. If this is out of the aformentioned range listed, please consider follow up with your Primary Care Provider.  ________________________________________________________  The [facility] providers would like to encourage you to use [facility] to communicate with providers for non-urgent requests or questions.  Due to long hold times on the telephone, sending your provider a message by [facility] may be a faster and more efficient way to get a response.  Please allow 48 business hours for a response.  Please remember that this is for non-urgent requests.  _______________________________________________________  Due to recent changes in healthcare laws, you may see the results of your imaging and laboratory studies on MyChart before your provider has had a chance to review them.  We understand that in some cases there may be results that are confusing or concerning to you. Not all laboratory results come back in the same time frame and the provider may be waiting for multiple results in order to interpret others.  Please give [place] 48 hours in order for your provider to thoroughly review all the results before contacting the office for clarification of your results.   You have been scheduled for an endoscopy tomorrow at 7:30 am. Please follow written instructions given to you at your visit today.  If you use inhalers (even only as needed), please bring them with you on the day of your procedure.  If you take  any of the following medications, they will need to be adjusted prior to your procedure:   DO NOT TAKE 7 DAYS PRIOR TO TEST- Trulicity (dulaglutide) Ozempic, Wegovy (semaglutide) Mounjaro (tirzepatide) Bydureon Bcise (exanatide extended release)  DO NOT TAKE 1 DAY PRIOR TO YOUR TEST Rybelsus (semaglutide) Adlyxin (lixisenatide) Victoza (liraglutide) Byetta (exanatide) ___________________________________________________________________________  Increase omeprazole to 40 mg twice daily.  Stop all marijuana use.  Stop Zofran  Continue Bentyl as needed  We have sent the following medications to your pharmacy for you to pick up at your convenience: Phenergan: Take every 8 hours as needed (Can cause drowsiness so take at bedtime)  Please purchase the following medications over the counter and take as directed: Citrucel: Take as directed, daily  Thank you for entrusting me with your care and for choosing [facility], Dr. Ileene Patrick
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
## 2023-11-07 ENCOUNTER — Encounter (HOSPITAL_COMMUNITY): Payer: Self-pay | Admitting: Nurse Practitioner

## 2023-11-07 ENCOUNTER — Encounter: Payer: Self-pay | Admitting: Gastroenterology

## 2023-11-07 ENCOUNTER — Ambulatory Visit (AMBULATORY_SURGERY_CENTER): Payer: Federal, State, Local not specified - PPO | Admitting: Gastroenterology

## 2023-11-07 ENCOUNTER — Encounter: Payer: Self-pay | Admitting: Nurse Practitioner

## 2023-11-07 ENCOUNTER — Other Ambulatory Visit: Payer: Federal, State, Local not specified - PPO

## 2023-11-07 VITALS — BP 124/83 | HR 61 | Temp 98.5°F | Resp 21 | Ht 69.0 in | Wt 140.0 lb

## 2023-11-07 DIAGNOSIS — Z8711 Personal history of peptic ulcer disease: Secondary | ICD-10-CM

## 2023-11-07 DIAGNOSIS — R1013 Epigastric pain: Secondary | ICD-10-CM | POA: Diagnosis not present

## 2023-11-07 DIAGNOSIS — T182XXA Foreign body in stomach, initial encounter: Secondary | ICD-10-CM

## 2023-11-07 DIAGNOSIS — R634 Abnormal weight loss: Secondary | ICD-10-CM

## 2023-11-07 MED ORDER — SODIUM CHLORIDE 0.9 % IV SOLN: 500.0000 mL | INTRAVENOUS | Status: DC

## 2023-11-07 NOTE — Patient Instructions (Addendum)
Recommendation:           - Patient has a contact number available for                            emergencies. The signs and symptoms of potential                            delayed complications were discussed with the                            patient. Return to normal activities tomorrow.                            Written discharge instructions were provided to the                            patient.                           - Advance diet as tolerated.                           - Continue present medications.                           - Continue with trial of medications as outlined in                            the office yesterday - omeprazole twice daily,                            phenergan for nausea PRN, bentyl PRN, Citrucel daily                           - Can send to the lab for blood draw to evaluate                            for celiac disease, and stool study for H pylori                           - Complete abstinence of marijuana                           - If symptoms persist despite this, can reschedule                            another EGD at a later time, clear liquid diet                            evening prior to the exam                           - Call with any worsening in the interim - will  discuss plan with patient and family  YOU HAD AN ENDOSCOPIC PROCEDURE TODAY AT THE [facility]:   Refer to the procedure report that was given to you for any specific questions about what was found during the examination.  If the procedure report does not answer your questions, please call your gastroenterologist to clarify.  If you requested that your care partner not be given the details of your procedure findings, then the procedure report has been included in a sealed envelope for you to review at your convenience later.  YOU SHOULD EXPECT: Some feelings of bloating in the abdomen. Passage of more gas than usual.  Walking can help  get rid of the air that was put into your GI tract during the procedure and reduce the bloating. If you had a lower endoscopy (such as a colonoscopy or flexible sigmoidoscopy) you may notice spotting of blood in your stool or on the toilet paper. If you underwent a bowel prep for your procedure, you may not have a normal bowel movement for a few days.  Please Note:  You might notice some irritation and congestion in your nose or some drainage.  This is from the oxygen used during your procedure.  There is no need for concern and it should clear up in a day or so.  SYMPTOMS TO REPORT IMMEDIATELY:  Following upper endoscopy (EGD)  Vomiting of blood or coffee ground material  New chest pain or pain under the shoulder blades  Painful or persistently difficult swallowing  New shortness of breath  Fever of 100F or higher  Black, tarry-looking stools  For urgent or emergent issues, a gastroenterologist can be reached at any hour by calling [phone]. Do not use MyChart messaging for urgent concerns.    DIET:  We do recommend a small meal at first, but then you may proceed to your regular diet.  Drink plenty of fluids but you should avoid alcoholic beverages for 24 hours.  ACTIVITY:  You should plan to take it easy for the rest of today and you should NOT DRIVE or use heavy machinery until tomorrow (because of the sedation medicines used during the test).    FOLLOW UP: Our staff will call the number listed on your records the next business day following your procedure.  We will call around 7:15- 8:00 am to check on you and address any questions or concerns that you may have regarding the information given to you following your procedure. If we do not reach you, we will leave a message.     If any biopsies were taken you will be contacted by phone or by letter within the next 1-3 weeks.  Please call us at [phone] if you have not heard about the biopsies in 3 weeks.     SIGNATURES/CONFIDENTIALITY: You and/or your care partner have signed paperwork which will be entered into your electronic medical record.  These signatures attest to the fact that that the information above on your After Visit Summary has been reviewed and is understood.  Full responsibility of the confidentiality of this discharge information lies with you and/or your care-partner.

## 2023-11-07 NOTE — Op Note (Signed)
[facility] Patient Name: Nou Deboer Procedure Date: 11/07/2023 7:25 AM MRN: [id number] Endoscopist: Viviann Spare P. Adela Lank , MD, [id number] Age: 22 Referring MD:  Date of Birth: 2001-03-23 Gender: Male Account #: [id number] Procedure:                Upper GI endoscopy Indications:              Epigastric abdominal pain, Weight loss - history of                            gastric ulcers. Endorses marijuana use. Medicines:                Monitored Anesthesia Care Procedure:                Pre-Anesthesia Assessment:                           - Prior to the procedure, a History and Physical                            was performed, and patient medications and                            allergies were reviewed. The patient's tolerance of                            previous anesthesia was also reviewed. The risks                            and benefits of the procedure and the sedation                            options and risks were discussed with the patient.                            All questions were answered, and informed consent                            was obtained. Prior Anticoagulants: The patient has                            taken no anticoagulant or antiplatelet agents. ASA                            Grade Assessment: II - A patient with mild systemic                            disease. After reviewing the risks and benefits,                            the patient was deemed in satisfactory condition to                            undergo the procedure.  After obtaining informed consent, the endoscope was                            passed under direct vision. Throughout the                            procedure, the patient's blood pressure, pulse, and                            oxygen saturations were monitored continuously. The                            Olympus scope [id number] was introduced through the                             mouth, and advanced to the second part of duodenum.                            The upper GI endoscopy was accomplished without                            difficulty. The patient tolerated the procedure                            well. Scope In: Scope Out: Findings:                 The Z-line was regular.                           The exam of the esophagus was otherwise normal.                           Food (residue) was found in the gastric fundus and                            in the gastric body. This prohibited views of the                            proximal stomach.                           Very brief exam of the distal stomach done to                            ensure patent pylorus and it was. No obvious ulcers                            seen but not much time take to view this area given                            retained food in the stomach.                           The  pyloric channel and duodenal bulb were patent -                            full evaluation of the duodenum not performed given                            residual food in the stomach. Complications:            No immediate complications. Estimated blood loss:                            None. Estimated Blood Loss:     Estimated blood loss: none. Impression:               - Z-line regular.                           - Food (residue) in the stomach - this prohibited                            full views of the stomach, very quick exam done to                            ensure patent pylorus. Normal stomach of what was                            visualized otherwise.                           - Normal duodenal bulb - full exam of duodenum not                            performed given residual food in the stomach.                           - Biopsies not taken given residual food in the                            stomach.                           No obvious pathology to account for symptoms but                             biopsies not taken. He does not have typical                            symptoms for gastroparesis. Recommendation:           - Patient has a contact number available for                            emergencies. The signs and symptoms of potential                            delayed  complications were discussed with the                            patient. Return to normal activities tomorrow.                            Written discharge instructions were provided to the                            patient.                           - Advance diet as tolerated.                           - Continue present medications.                           - Continue with trial of medications as outlined in                            the office yesterday - omeprazole twice daily,                            phenergan for nausea PRN, bentyl PRN, Citrucel daily                           - Can send to the lab for blood draw to evaluate                            for celiac disease, and stool study for H pylori                           - Complete abstinence of marijuana                           - If symptoms persist despite this, can reschedule                            another EGD at a later time, clear liquid diet                            evening prior to the exam                           - Call with any worsening in the interim - will                            discuss plan with patient and family Willaim Rayas. Molli Gethers, MD 11/07/2023 7:51:00 AM This report has been signed electronically.

## 2023-11-07 NOTE — Progress Notes (Signed)
Pt's states no medical or surgical changes since previsit or office visit. 

## 2023-11-07 NOTE — Progress Notes (Signed)
Sedate, gd SR, tolerated procedure well, VSS, report to RN 

## 2023-11-07 NOTE — Telephone Encounter (Signed)
Copied from CRM [id number]. Topic: General - Other >> Nov 07, 2023  9:07 AM Carlatta H wrote: Reason for CRM: Patient needs a doctors note stating he cleared to go back to work//Please fax note to [phone]//Can call patient at [phone]

## 2023-11-07 NOTE — Progress Notes (Signed)
History and Physical Interval Note: Seen yesterday in the office - see note for details. EGD to evaluate significant epigastric pain, weight loss, history of ulcers. No interval changes. He wishes to proceed following discussion of risks / benefits. He otherwise denies any complaints today.    11/07/2023 7:33 AM  Stephen Underwood  has presented today for endoscopic procedure(s), with the diagnosis of  Encounter Diagnoses  Name Primary?   Abdominal pain, epigastric Yes   Weight loss    History of gastric ulcer   .  The various methods of evaluation and treatment have been discussed with the patient and/or family. After consideration of risks, benefits and other options for treatment, the patient has consented to  the endoscopic procedure(s).   The patient's history has been reviewed, patient examined, no change in status, stable for surgery.  I have reviewed the patient's chart and labs.  Questions were answered to the patient's satisfaction.    Harlin Rain, MD [facility]

## 2023-11-08 LAB — TISSUE TRANSGLUTAMINASE ABS,IGG,IGA
(tTG) Ab, IgA: <1 U/mL
(tTG) Ab, IgG: <1 U/mL

## 2023-11-08 LAB — IGA: Immunoglobulin A: 187 mg/dL (ref 47–310)

## 2023-11-10 ENCOUNTER — Telehealth: Payer: Self-pay

## 2023-11-10 NOTE — Telephone Encounter (Signed)
  Follow up Call-     11/07/2023    7:31 AM  Call back number  Post procedure Call Back phone  # [phone]  Permission to leave phone message Yes     Patient questions:  Do you have a fever, pain , or abdominal swelling? No. Pain Score  0 *  Have you tolerated food without any problems? Yes.    Have you been able to return to your normal activities? Yes.    Do you have any questions about your discharge instructions: Diet   No. Medications  No. Follow up visit  No.  Do you have questions or concerns about your Care? No.  Actions: * If pain score is 4 or above: No action needed, pain <4.

## 2023-11-14 ENCOUNTER — Telehealth: Payer: Self-pay

## 2023-11-14 NOTE — Telephone Encounter (Signed)
Message sent to our office in error.  Not our patient.  Forwarded to listed PCP for review.

## 2023-11-14 NOTE — Telephone Encounter (Signed)
Copied from CRM [id number]. Topic: General - Other >> Nov 13, 2023  1:58 PM Geneva B wrote: Reason for CRM: pt is calling in he needs a note for him beng cleared to go back to work please fax to [phone] to [employer]

## 2023-11-17 NOTE — Telephone Encounter (Signed)
Pt was called and advise letter was in his my chart. Kh

## 2023-11-24 ENCOUNTER — Ambulatory Visit: Payer: Self-pay | Admitting: Nurse Practitioner

## 2023-11-26 ENCOUNTER — Ambulatory Visit: Payer: Federal, State, Local not specified - PPO | Admitting: Registered"

## 2023-12-17 ENCOUNTER — Ambulatory Visit (HOSPITAL_COMMUNITY): Payer: Federal, State, Local not specified - PPO | Admitting: Family

## 2023-12-22 ENCOUNTER — Ambulatory Visit: Payer: Self-pay | Admitting: Nurse Practitioner

## 2024-01-07 ENCOUNTER — Other Ambulatory Visit: Payer: Self-pay | Admitting: Nurse Practitioner

## 2024-01-07 DIAGNOSIS — R109 Unspecified abdominal pain: Secondary | ICD-10-CM

## 2024-01-07 DIAGNOSIS — Z8711 Personal history of peptic ulcer disease: Secondary | ICD-10-CM

## 2024-01-21 ENCOUNTER — Ambulatory Visit (HOSPITAL_COMMUNITY): Payer: Self-pay | Admitting: Licensed Clinical Social Worker

## 2024-01-26 ENCOUNTER — Encounter: Payer: Self-pay | Admitting: Neurology

## 2024-01-26 ENCOUNTER — Ambulatory Visit (INDEPENDENT_AMBULATORY_CARE_PROVIDER_SITE_OTHER): Payer: Federal, State, Local not specified - PPO | Admitting: Neurology

## 2024-01-26 VITALS — BP 126/84 | HR 66 | Ht 69.0 in | Wt 157.5 lb

## 2024-01-26 DIAGNOSIS — F445 Conversion disorder with seizures or convulsions: Secondary | ICD-10-CM

## 2024-01-26 DIAGNOSIS — R569 Unspecified convulsions: Secondary | ICD-10-CM

## 2024-01-26 NOTE — Patient Instructions (Signed)
 Continue current medications Continue follow-up PCP Continue with therapy Return as needed.
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
## 2024-02-12 ENCOUNTER — Other Ambulatory Visit: Payer: Self-pay | Admitting: Nurse Practitioner

## 2024-02-12 NOTE — Telephone Encounter (Signed)
 Copied from CRM [id number]. Topic: Clinical - Medication Refill >> Feb 12, 2024 12:30 PM Gildardo Pounds wrote: Most Recent Primary Care Visit:  Provider: Donell Beers  Department: [facility]  Visit Type: NEW PATIENT  Date: 10/13/2023  Medication: escitalopram (LEXAPRO) 10 MG tablet  Has the patient contacted their pharmacy? Yes (Agent: If no, request that the patient contact the pharmacy for the refill. If patient does not wish to contact the pharmacy document the reason why and proceed with request.) (Agent: If yes, when and what did the pharmacy advise?)  Is this the correct pharmacy for this prescription? Yes If no, delete pharmacy and type the correct one.  This is the patient's preferred pharmacy:  [id number] - [place], [place] - [address] AT [facility] [address] [place] [address] Phone: [phone] Fax: [phone]    Has the prescription been filled recently? No  Is the patient out of the medication? Yes  Has the patient been seen for an appointment in the last year OR does the patient have an upcoming appointment? Yes  Can we respond through MyChart? Yes  Agent: Please be advised that Rx refills may take up to 3 business days. We ask that you follow-up with your pharmacy.

## 2024-02-13 ENCOUNTER — Other Ambulatory Visit: Payer: Self-pay | Admitting: Nurse Practitioner

## 2024-02-13 MED ORDER — ESCITALOPRAM OXALATE 10 MG PO TABS: 10.0000 mg | ORAL_TABLET | Freq: Every day | ORAL | 3 refills | Status: DC

## 2024-02-13 MED ORDER — ESCITALOPRAM OXALATE 10 MG PO TABS: 10.0000 mg | ORAL_TABLET | Freq: Every day | ORAL | 3 refills | Status: AC

## 2024-02-14 ENCOUNTER — Emergency Department (HOSPITAL_COMMUNITY)
Admission: EM | Admit: 2024-02-14 | Discharge: 2024-02-14 | Disposition: A | Discharge status: Home / Self Care | Attending: Emergency Medicine | Admitting: Emergency Medicine

## 2024-02-14 ENCOUNTER — Other Ambulatory Visit: Payer: Self-pay

## 2024-02-14 ENCOUNTER — Emergency Department (HOSPITAL_COMMUNITY)

## 2024-02-14 DIAGNOSIS — S0990XA Unspecified injury of head, initial encounter: Secondary | ICD-10-CM

## 2024-02-14 DIAGNOSIS — J342 Deviated nasal septum: Secondary | ICD-10-CM | POA: Diagnosis not present

## 2024-02-14 DIAGNOSIS — M542 Cervicalgia: Secondary | ICD-10-CM | POA: Insufficient documentation

## 2024-02-14 DIAGNOSIS — R040 Epistaxis: Secondary | ICD-10-CM | POA: Diagnosis not present

## 2024-02-14 DIAGNOSIS — S0083XA Contusion of other part of head, initial encounter: Secondary | ICD-10-CM | POA: Diagnosis not present

## 2024-02-14 DIAGNOSIS — S022XXA Fracture of nasal bones, initial encounter for closed fracture: Secondary | ICD-10-CM | POA: Insufficient documentation

## 2024-02-14 DIAGNOSIS — R404 Transient alteration of awareness: Secondary | ICD-10-CM | POA: Diagnosis not present

## 2024-02-14 DIAGNOSIS — T171XXA Foreign body in nostril, initial encounter: Secondary | ICD-10-CM | POA: Diagnosis not present

## 2024-02-14 DIAGNOSIS — R000 Tachycardia, unspecified: Secondary | ICD-10-CM | POA: Diagnosis not present

## 2024-02-14 DIAGNOSIS — G4089 Other seizures: Secondary | ICD-10-CM | POA: Diagnosis not present

## 2024-02-14 DIAGNOSIS — R569 Unspecified convulsions: Secondary | ICD-10-CM | POA: Diagnosis not present

## 2024-02-14 DIAGNOSIS — S0992XA Unspecified injury of nose, initial encounter: Secondary | ICD-10-CM | POA: Diagnosis not present

## 2024-02-14 DIAGNOSIS — R1030 Lower abdominal pain, unspecified: Secondary | ICD-10-CM | POA: Diagnosis not present

## 2024-02-14 LAB — URINALYSIS, ROUTINE W REFLEX MICROSCOPIC
Bilirubin Urine: NEGATIVE
Glucose, UA: NEGATIVE mg/dL
Hgb urine dipstick: NEGATIVE
Ketones, ur: NEGATIVE mg/dL
Leukocytes,Ua: NEGATIVE
Nitrite: NEGATIVE
Protein, ur: NEGATIVE mg/dL
Specific Gravity, Urine: 1.004 — ABNORMAL LOW (ref 1.005–1.030)
pH: 5 (ref 5.0–8.0)

## 2024-02-14 LAB — COMPREHENSIVE METABOLIC PANEL WITH GFR
ALT: 15 U/L (ref 0–44)
AST: 20 U/L (ref 15–41)
Albumin: 3.9 g/dL (ref 3.5–5.0)
Alkaline Phosphatase: 59 U/L (ref 38–126)
Anion gap: 13 (ref 5–15)
BUN: 8 mg/dL (ref 6–20)
CO2: 22 mmol/L (ref 22–32)
Calcium: 9.3 mg/dL (ref 8.9–10.3)
Chloride: 106 mmol/L (ref 98–111)
Creatinine, Ser: 0.78 mg/dL (ref 0.61–1.24)
GFR, Estimated: >60 mL/min (ref >60)
Glucose, Bld: 101 mg/dL — ABNORMAL HIGH (ref 70–99)
Potassium: 3.8 mmol/L (ref 3.5–5.1)
Sodium: 141 mmol/L (ref 135–145)
Total Bilirubin: 0.6 mg/dL (ref 0.0–1.2)
Total Protein: 6.8 g/dL (ref 6.5–8.1)

## 2024-02-14 LAB — CBC
HCT: 44.2 % (ref 39.0–52.0)
Hemoglobin: 14.2 g/dL (ref 13.0–17.0)
MCH: 29.4 pg (ref 26.0–34.0)
MCHC: 32.1 g/dL (ref 30.0–36.0)
MCV: 91.5 fL (ref 80.0–100.0)
Platelets: 202 10*3/uL (ref 150–400)
RBC: 4.83 MIL/uL (ref 4.22–5.81)
RDW: 13.1 % (ref 11.5–15.5)
WBC: 8.4 10*3/uL (ref 4.0–10.5)
nRBC: 0 % (ref 0.0–0.2)

## 2024-02-14 NOTE — ED Notes (Signed)
 Patient Alert and oriented to baseline. Stable and ambulatory to baseline. Patient verbalized understanding of the discharge instructions.  Patient belongings were taken by the patient.
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
## 2024-02-14 NOTE — ED Notes (Addendum)
 GC Christ with [place] [employer] at bedside.
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
## 2024-02-14 NOTE — ED Notes (Signed)
 Patient transported to CT via stretcher.

## 2024-04-14 ENCOUNTER — Emergency Department (HOSPITAL_COMMUNITY)
Admission: EM | Admit: 2024-04-14 | Discharge: 2024-04-15 | Disposition: A | Discharge status: Home / Self Care | Attending: Emergency Medicine | Admitting: Emergency Medicine

## 2024-04-14 ENCOUNTER — Encounter (HOSPITAL_COMMUNITY): Payer: Self-pay | Admitting: Emergency Medicine

## 2024-04-14 ENCOUNTER — Other Ambulatory Visit: Payer: Self-pay

## 2024-04-14 DIAGNOSIS — F445 Conversion disorder with seizures or convulsions: Secondary | ICD-10-CM | POA: Insufficient documentation

## 2024-04-14 DIAGNOSIS — I10 Essential (primary) hypertension: Secondary | ICD-10-CM | POA: Diagnosis not present

## 2024-04-14 DIAGNOSIS — R569 Unspecified convulsions: Secondary | ICD-10-CM | POA: Diagnosis not present

## 2024-04-14 DIAGNOSIS — Z87898 Personal history of other specified conditions: Secondary | ICD-10-CM

## 2024-04-14 DIAGNOSIS — Z5900 Homelessness unspecified: Secondary | ICD-10-CM | POA: Insufficient documentation
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
## 2024-04-15 NOTE — ED Provider Notes (Signed)
 [place] EMERGENCY DEPARTMENT AT [facility] Provider Note  CSN: [id number] Arrival date & time: 04/14/24 2354  Chief Complaint(s) Seizure-like activity  HPI Stephen Underwood is a 23 y.o. male with a past medical history listed below including nonepileptic psychogenic seizures here after seizure activity while being questioned by PD.  Seizures were witnessed by PD present.  He reported the patient was banging on the table with his hand and went to the ground and whole body was convulsing.  Patient would intermittently stop and speak during these episodes.  In total and lasted about 5 minutes.  There was no postictal state.  Patient admits to stress-induced seizures.  Reports that he typically takes Lexapro  but has not taken it since being homeless for the past week.  HPI  Past Medical History Past Medical History:  Diagnosis Date   Anxiety    History of gastric ulcer 10/13/2023   History of tympanostomy tube placement 2003   MDD (major depressive disorder), recurrent episode, severe (HCC) 11/08/2022   Mild to moderate hearing loss    Polysubstance abuse (HCC) 09/29/2023   Seizures (HCC)    Patient Active Problem List   Diagnosis Date Noted   Urinary frequency 10/13/2023   Anxiety 10/13/2023   Abdominal pain 10/13/2023   History of gastric ulcer 10/13/2023   Vapes nicotine  containing substance 10/13/2023   Right flank pain 10/13/2023   Nausea 10/13/2023   Polysubstance abuse (HCC) 09/29/2023   Seizure-like activity (HCC) 09/29/2023   Metabolic acidosis 09/29/2023   Hip dislocation, left, initial encounter (HCC) 01/11/2023   Liver laceration, grade III, with open wound into cavity, initial encounter 01/10/2023   MDD (major depressive disorder), recurrent episode, severe (HCC) 11/08/2022   Status epilepticus (HCC) 11/05/2022   Chronic respiratory failure with hypoxia (HCC) 11/05/2022   Moderate malnutrition (HCC) 09/21/2019   Poor appetite 09/21/2019   Weight loss  09/20/2019   Vomiting 09/20/2019   Vision changes 09/20/2019   Home Medication(s) Prior to Admission medications   Medication Sig Start Date End Date Taking? Authorizing Provider  acetaminophen  (TYLENOL ) 500 MG tablet Take 500 mg by mouth every 6 (six) hours as needed.    [provider]  dicyclomine  (BENTYL ) 20 MG tablet Take 1 tablet (20 mg total) by mouth 2 (two) times daily. 10/08/23   Harlow Lighter, [place]  N, FNP  escitalopram  (LEXAPRO ) 10 MG tablet Take 1 tablet (10 mg total) by mouth daily. Take 5 mg for 4 days then increase to 10 mg daily 02/13/24 02/12/25  Paseda, Folashade R, FNP  methylcellulose (CITRUCEL) oral powder Take 1 packet by mouth daily. 11/06/23   Armbruster, Lendon Queen, MD  omeprazole  (PRILOSEC) 40 MG capsule TAKE 1 CAPSULE (40 MG TOTAL) BY MOUTH DAILY. 01/07/24   Paseda, Folashade R, FNP  promethazine  (PHENERGAN ) 12.5 MG tablet Take 1 tablet (12.5 mg total) by mouth every 8 (eight) hours as needed for nausea or vomiting. Take at bedtime as can cause drowsiness 11/06/23   Armbruster, Lendon Queen, MD  Allergies Onion  Review of Systems Review of Systems As noted in HPI  Physical Exam Vital Signs  I have reviewed the triage vital signs BP 110/76   Pulse 71   Temp 98.2 F (36.8 C)   Resp 18   Ht 5\' 9"  (1.753 m)   Wt 71.4 kg   SpO2 100%   BMI 23.25 kg/m   Physical Exam Vitals reviewed.  Constitutional:      General: He is not in acute distress.    Appearance: He is well-developed. He is not diaphoretic.  HENT:     Head: Normocephalic and atraumatic.     Nose: Nose normal.  Eyes:     General: No scleral icterus.       Right eye: No discharge.        Left eye: No discharge.     Conjunctiva/sclera: Conjunctivae normal.     Pupils: Pupils are equal, round, and reactive to light.  Cardiovascular:     Rate and Rhythm: Normal rate  and regular rhythm.     Heart sounds: No murmur heard.    No friction rub. No gallop.  Pulmonary:     Effort: Pulmonary effort is normal. No respiratory distress.     Breath sounds: Normal breath sounds. No stridor. No rales.  Abdominal:     General: There is no distension.     Palpations: Abdomen is soft.     Tenderness: There is no abdominal tenderness.  Musculoskeletal:        General: No tenderness.     Cervical back: Normal range of motion and neck supple.  Skin:    General: Skin is warm and dry.     Findings: No erythema or rash.  Neurological:     Mental Status: He is alert and oriented to person, place, and time.     ED Results and Treatments Labs (all labs ordered are listed, but only abnormal results are displayed) Labs Reviewed - No data to display                                                                                                                       EKG  EKG Interpretation Date/Time:  Thursday Apr 15 2024 01:41:31 EDT Ventricular Rate:  62 PR Interval:  212 QRS Duration:  92 QT Interval:  384 QTC Calculation: 389 R Axis:   92  Text Interpretation: Sinus rhythm with 1st degree A-V block Rightward axis Borderline ECG When compared with ECG of 14-Feb-2024 06:14, PREVIOUS ECG IS PRESENT Confirmed by Townsend Freud [phone]) on 04/15/2024 2:44:05 AM       Radiology No results found.  Medications Ordered in ED Medications - No data to display Procedures Procedures  (including critical care time) Medical Decision Making / ED Course   Medical Decision Making Amount and/or Complexity of Data Reviewed ECG/medicine tests: ordered and independent interpretation performed. Decision-making details documented in ED Course.    Seizure-like activity described is consistent with nonepileptic psychogenic seizures likely  related from stressful situation.  EKG obtained to rule out dysrhythmias or interval changes which was reassuring.  Patient was monitored  for several hours without recurrence.    Final Clinical Impression(s) / ED Diagnoses Final diagnoses:  History of psychogenic nonepileptic seizure   The patient appears reasonably screened and/or stabilized for discharge and I doubt any other medical condition or other [facility] requiring further screening, evaluation, or treatment in the ED at this time. I have discussed the findings, Dx and Tx plan with the patient/family who expressed understanding and agree(s) with the plan. Discharge instructions discussed at length. The patient/family was given strict return precautions who verbalized understanding of the instructions. No further questions at time of discharge.  Disposition: Discharge  Condition: Good  ED Discharge Orders     None         Follow Up: Paseda, Folashade R, FNP [address] [place], [place] [address] [phone]  Call  as needed    This chart was dictated using voice recognition software.  Despite best efforts to proofread,  errors can occur which can change the documentation meaning.    Lindle Rhea, MD 04/15/24 [phone]

## 2024-08-18 ENCOUNTER — Emergency Department (HOSPITAL_COMMUNITY)

## 2024-08-18 ENCOUNTER — Emergency Department (HOSPITAL_COMMUNITY)
Admission: EM | Admit: 2024-08-18 | Discharge: 2024-08-18 | Attending: Emergency Medicine | Admitting: Emergency Medicine

## 2024-08-18 ENCOUNTER — Telehealth: Payer: Self-pay | Admitting: Obstetrics and Gynecology

## 2024-08-18 ENCOUNTER — Encounter (HOSPITAL_COMMUNITY): Payer: Self-pay

## 2024-08-18 DIAGNOSIS — S02652A Fracture of angle of left mandible, initial encounter for closed fracture: Secondary | ICD-10-CM | POA: Diagnosis not present

## 2024-08-18 DIAGNOSIS — S0993XA Unspecified injury of face, initial encounter: Secondary | ICD-10-CM | POA: Diagnosis present

## 2024-08-18 HISTORY — DX: Conversion disorder with seizures or convulsions: F44.5

## 2024-08-18 MED ORDER — ACETAMINOPHEN 500 MG PO TABS: 500.0000 mg | ORAL_TABLET | Freq: Four times a day (QID) | ORAL | 0 refills | Status: DC | PRN

## 2024-08-18 MED ORDER — IBUPROFEN 200 MG PO TABS: 400.0000 mg | ORAL_TABLET | ORAL | 0 refills | Status: DC | PRN

## 2024-08-18 MED ORDER — ONDANSETRON HCL 4 MG/2ML IJ SOLN
4.0000 mg | Freq: Once | INTRAMUSCULAR | Status: AC
  Administered 2024-08-18: 4 mg via INTRAVENOUS
  Filled 2024-08-18: qty 2

## 2024-08-18 MED ORDER — MORPHINE SULFATE (PF) 4 MG/ML IV SOLN
4.0000 mg | Freq: Once | INTRAVENOUS | Status: AC
  Administered 2024-08-18: 4 mg via INTRAVENOUS
  Filled 2024-08-18: qty 1

## 2024-08-18 MED ORDER — ACETAMINOPHEN-CODEINE 300-30 MG PO TABS: 1.0000 | ORAL_TABLET | Freq: Four times a day (QID) | ORAL | 0 refills | Status: AC | PRN

## 2024-08-18 MED ORDER — ACETAMINOPHEN 500 MG PO TABS: 500.0000 mg | ORAL_TABLET | Freq: Four times a day (QID) | ORAL | 0 refills | Status: AC | PRN

## 2024-08-18 MED ORDER — IBUPROFEN 200 MG PO TABS: 400.0000 mg | ORAL_TABLET | ORAL | 0 refills | Status: AC | PRN
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
